# Patient Record
Sex: Male | Born: 1950 | Race: White | Hispanic: No | Marital: Single | Smoking: Never smoker
Health system: Southern US, Community
[De-identification: ages and names within clinical notes are randomized; demographics above are authoritative.]

## PROBLEM LIST (undated history)

## (undated) DIAGNOSIS — E785 Hyperlipidemia, unspecified: Secondary | ICD-10-CM

## (undated) HISTORY — PX: EYE SURGERY: SHX253

---

## 2013-11-18 ENCOUNTER — Ambulatory Visit
Admission: RE | Admit: 2013-11-18 | Discharge: 2013-11-18 | Disposition: A | Discharge status: Home / Self Care | Payer: BC Managed Care – PPO | Source: Ambulatory Visit | Attending: Family Medicine | Admitting: Family Medicine

## 2013-11-18 ENCOUNTER — Other Ambulatory Visit: Payer: Self-pay | Admitting: Family Medicine

## 2013-11-18 DIAGNOSIS — M542 Cervicalgia: Secondary | ICD-10-CM

## 2015-01-11 ENCOUNTER — Other Ambulatory Visit: Payer: Self-pay | Admitting: Family Medicine

## 2015-01-11 DIAGNOSIS — Z136 Encounter for screening for cardiovascular disorders: Secondary | ICD-10-CM

## 2015-12-20 ENCOUNTER — Other Ambulatory Visit: Payer: Self-pay | Admitting: Family Medicine

## 2015-12-20 DIAGNOSIS — Z0000 Encounter for general adult medical examination without abnormal findings: Secondary | ICD-10-CM

## 2016-02-18 ENCOUNTER — Ambulatory Visit
Admission: RE | Admit: 2016-02-18 | Discharge: 2016-02-18 | Disposition: A | Discharge status: Home / Self Care | Payer: BC Managed Care – PPO | Source: Ambulatory Visit | Attending: Family Medicine | Admitting: Family Medicine

## 2016-02-18 ENCOUNTER — Other Ambulatory Visit: Payer: Self-pay | Admitting: Family Medicine

## 2016-02-18 DIAGNOSIS — Z0000 Encounter for general adult medical examination without abnormal findings: Secondary | ICD-10-CM

## 2017-04-30 ENCOUNTER — Emergency Department (HOSPITAL_COMMUNITY): Payer: BC Managed Care – PPO

## 2017-04-30 ENCOUNTER — Encounter (HOSPITAL_COMMUNITY): Payer: Self-pay | Admitting: Emergency Medicine

## 2017-04-30 ENCOUNTER — Emergency Department (HOSPITAL_COMMUNITY)
Admission: EM | Admit: 2017-04-30 | Discharge: 2017-04-30 | Disposition: A | Discharge status: Home / Self Care | Payer: BC Managed Care – PPO | Attending: Emergency Medicine | Admitting: Emergency Medicine

## 2017-04-30 DIAGNOSIS — Z79899 Other long term (current) drug therapy: Secondary | ICD-10-CM | POA: Insufficient documentation

## 2017-04-30 DIAGNOSIS — M25511 Pain in right shoulder: Secondary | ICD-10-CM | POA: Diagnosis present

## 2017-04-30 DIAGNOSIS — M5412 Radiculopathy, cervical region: Secondary | ICD-10-CM

## 2017-04-30 DIAGNOSIS — R51 Headache: Secondary | ICD-10-CM | POA: Diagnosis not present

## 2017-04-30 HISTORY — DX: Hyperlipidemia, unspecified: E78.5

## 2017-04-30 LAB — BASIC METABOLIC PANEL
Anion gap: 8 (ref 5–15)
BUN: 24 mg/dL — AB (ref 6–20)
CHLORIDE: 106 mmol/L (ref 101–111)
CO2: 28 mmol/L (ref 22–32)
CREATININE: 1.14 mg/dL (ref 0.61–1.24)
Calcium: 9.3 mg/dL (ref 8.9–10.3)
GFR calc Af Amer: >60 mL/min (ref >60)
GFR calc non Af Amer: >60 mL/min (ref >60)
GLUCOSE: 104 mg/dL — AB (ref 65–99)
POTASSIUM: 4.8 mmol/L (ref 3.5–5.1)
Sodium: 142 mmol/L (ref 135–145)

## 2017-04-30 LAB — CBC WITH DIFFERENTIAL/PLATELET
Basophils Absolute: 0 10*3/uL (ref 0.0–0.1)
Basophils Relative: 0 %
EOS ABS: 0.2 10*3/uL (ref 0.0–0.7)
EOS PCT: 4 %
HCT: 41.9 % (ref 39.0–52.0)
Hemoglobin: 15.1 g/dL (ref 13.0–17.0)
LYMPHS ABS: 1.2 10*3/uL (ref 0.7–4.0)
LYMPHS PCT: 22 %
MCH: 32.4 pg (ref 26.0–34.0)
MCHC: 36 g/dL (ref 30.0–36.0)
MCV: 89.9 fL (ref 78.0–100.0)
MONO ABS: 0.5 10*3/uL (ref 0.1–1.0)
Monocytes Relative: 9 %
Neutro Abs: 3.6 10*3/uL (ref 1.7–7.7)
Neutrophils Relative %: 65 %
PLATELETS: 182 10*3/uL (ref 150–400)
RBC: 4.66 MIL/uL (ref 4.22–5.81)
RDW: 12.1 % (ref 11.5–15.5)
WBC: 5.6 10*3/uL (ref 4.0–10.5)

## 2017-04-30 LAB — I-STAT TROPONIN, ED: Troponin i, poc: 0.01 ng/mL (ref 0.00–0.08)

## 2017-04-30 MED ORDER — PREDNISONE 20 MG PO TABS: ORAL_TABLET | ORAL | 0 refills | Status: DC

## 2017-04-30 MED ORDER — LORAZEPAM 2 MG/ML IJ SOLN
2.0000 mg | Freq: Once | INTRAMUSCULAR | Status: AC
  Administered 2017-04-30: 2 mg via INTRAVENOUS
  Filled 2017-04-30: qty 1

## 2017-04-30 MED ORDER — GADOBENATE DIMEGLUMINE 529 MG/ML IV SOLN
20.0000 mL | Freq: Once | INTRAVENOUS | Status: AC | PRN
  Administered 2017-04-30: 19 mL via INTRAVENOUS

## 2017-04-30 NOTE — ED Triage Notes (Signed)
Pt c/o R shoulder pain radiating to R neck and headache, intermittent since this morning. Pt states he has had R shoulder pain x several years and treated with injections. Pt noticed tingling and numbness down R hand intermittently today. Pt states he also feels tightness in R jaw.

## 2017-04-30 NOTE — ED Provider Notes (Signed)
[facility] DEPT Provider Note   CSN: [id number] Arrival date & time: 04/30/17  0803     History   Chief Complaint Chief Complaint  Patient presents with  . Shoulder Pain    R  . Neck Pain    R  . Headache    HPI Andre Vega is a 66 y.o. male.  Patient is a 66 year old male with history of hyperlipidemia who presents with right-sided shoulder and neck pain. He states he has a history of some chronic pain in his right shoulder and he's had some prior injections in the shoulder. He states it's been hurting a little bit worse over the last few days but this morning he woke up and had pain that was going up into his right neck with the feeling that the right side of his face was numb and "drawing up".  He also felt the pain was going to the back of his neck on the right side as well as the front part of his neck on the right side. Also is going across his right chest and down his right arm. He felt like his right arm is weaker than normal.  He also reported some numbness in his right hand but denies any current numbness in the right hand. He denies any numbness or weakness to his legs. No slurred speech. No vision changes. No recent falls or injuries.  No ataxia.  He did say that his right arm got clammy during this incident but is back to normal currently.      Past Medical History:  Diagnosis Date  . Hyperlipidemia     There are no active problems to display for this patient.   Past Surgical History:  Procedure Laterality Date  . EYE SURGERY         Home Medications    Prior to Admission medications   Medication Sig Start Date End Date Taking? Authorizing Provider  aspirin EC 81 MG tablet Take 81 mg by mouth daily.   Yes [provider]  Cholecalciferol (VITAMIN D PO) Take 1 tablet by mouth daily.    Yes [provider]  CRESTOR 5 MG tablet Take 5 mg by mouth daily. 04/06/17  Yes [provider]  Cyanocobalamin (VITAMIN B 12 PO) Take 1  tablet by mouth daily.    Yes [provider]  escitalopram (LEXAPRO) 10 MG tablet Take 10 mg by mouth daily. 02/10/17  Yes [provider]  Ubiquinol 50 MG CAPS Take 50 mg by mouth daily.   Yes [provider]  predniSONE (DELTASONE) 20 MG tablet 3 tabs po day one, then 2 po daily x 4 days 04/30/17   [name], MD    Family History History reviewed. No pertinent family history.  Social History Social History  Substance Use Topics  . Smoking status: Never Smoker  . Smokeless tobacco: Never Used  . Alcohol use Yes     Allergies   Sulfa antibiotics   Review of Systems Review of Systems  Constitutional: Negative for chills and fever.  HENT: Negative for facial swelling, trouble swallowing and voice change.   Eyes: Negative for visual disturbance.  Respiratory: Negative for chest tightness and shortness of breath.   Cardiovascular: Positive for chest pain.  Gastrointestinal: Negative for nausea and vomiting.  Genitourinary: Negative.   Musculoskeletal: Positive for arthralgias and neck pain. Negative for back pain, joint swelling and neck stiffness.  Skin: Negative for color change.  Neurological: Positive for weakness and numbness. Negative  for speech difficulty and headaches.  Psychiatric/Behavioral: The patient is not nervous/anxious.      Physical Exam Updated Vital Signs BP (!) 155/98 (BP Location: Left Arm)   Pulse 66   Temp 98 F (36.7 C) (Oral)   Resp 16   Ht 6' (1.829 m)   Wt 92.5 kg (204 lb)   SpO2 99%   BMI 27.67 kg/m   Physical Exam  Constitutional: He is oriented to person, place, and time. He appears well-developed and well-nourished.  HENT:  Head: Normocephalic and atraumatic.  No carotid bruit  Eyes: Pupils are equal, round, and reactive to light.  Neck: Normal range of motion. Neck supple.  Patient has some tenderness to the right cervical paraspinal area and along the right trapezius muscle.There some mild pain to  the anterior portion of the neck as well. No swelling.  Cardiovascular: Normal rate, regular rhythm and normal heart sounds.   Pulmonary/Chest: Effort normal and breath sounds normal. No respiratory distress. He has no wheezes. He has no rales. He exhibits no tenderness.  Abdominal: Soft. Bowel sounds are normal. There is no tenderness. There is no rebound and no guarding.  Musculoskeletal: Normal range of motion. He exhibits no edema.  There some mild pain on range of motion of the right shoulder. There is no swelling to the arm. Radial pulses are intact. No discoloration of the arm.  Lymphadenopathy:    He has no cervical adenopathy.  Neurological: He is alert and oriented to person, place, and time.  Motor 5/5 all extremities Sensation grossly intact to LT all extremities Finger to Nose intact, no pronator drift CN II-XII grossly intact    Skin: Skin is warm and dry. No rash noted.  Psychiatric: He has a normal mood and affect.     ED Treatments / Results  Labs (all labs ordered are listed, but only abnormal results are displayed) Labs Reviewed  BASIC METABOLIC PANEL - Abnormal; Notable for the following:       Result Value   Glucose, Bld 104 (*)    BUN 24 (*)    All other components within normal limits  CBC WITH DIFFERENTIAL/PLATELET  I-STAT TROPONIN, ED    EKG  EKG Interpretation  Date/Time:  Thursday April 30 2017 09:23:37 EDT Ventricular Rate:  65 PR Interval:    QRS Duration: 95 QT Interval:  408 QTC Calculation: 425 R Axis:   25 Text Interpretation:  Sinus rhythm No old tracing to compare Confirmed by [name] [phone]) on 04/30/2017 9:40:19 AM       Radiology Mr Angiogram Neck W Or Wo Contrast  Result Date: 04/30/2017 CLINICAL DATA:  Pain radiating to the right neck.  Headache. EXAM: MRA NECK WITHOUT AND WITH CONTRAST TECHNIQUE: Multiplanar and multiecho pulse sequences of the neck were obtained without and with intravenous contrast. Angiographic  images of the neck were obtained using MRA technique without and with intravenous contrast. CONTRAST:  75m MULTIHANCE GADOBENATE DIMEGLUMINE 529 MG/ML IV SOLN COMPARISON:  None. FINDINGS: Normal appearance of the visualized aorta. Four vessel branching pattern with left vertebral artery arising from the arch. Vertebral and carotid vessels are smooth and widely patent to the dura. There is antegrade flow in both carotid and vertebral circulations. IMPRESSION: Negative neck MRA.  No stenosis or evidence of dissection. Electronically Signed   By: JMonte FantasiaM.D.   On: 04/30/2017 12:01   Mr Cervical Spine Wo Contrast  Result Date: 04/30/2017 CLINICAL DATA:  Acute presentation with right shoulder pain  and right neck pain. Headache. Symptoms began this morning. Tingling in the right hand. EXAM: MRI CERVICAL SPINE WITHOUT CONTRAST TECHNIQUE: Multiplanar, multisequence MR imaging of the cervical spine was performed. No intravenous contrast was administered. COMPARISON:  Radiography 11/18/2013 FINDINGS: The study suffers from motion degradation because of patient discomfort. Alignment: Normal except for 2 mm retrolisthesis C4-5. Vertebrae: Discogenic edematous marrow changes at C4-5 and C5-6 which could be associated with neck pain. Cord: No cord compression or primary cord lesion. Posterior Fossa, vertebral arteries, paraspinal tissues: Negative Disc levels: Foramen magnum, and C1-2:  Normal. C2-3:  Chronic posterior element fusion on the right.  No stenosis. C3-4: Small endplate osteophytes and mild bulging of the disc. Mild facet arthritis. No compressive stenosis of the canal. Mild bilateral foraminal narrowing. C4-5: 2 mm retrolisthesis. Spondylosis with endplate osteophytes that narrow the ventral subarachnoid space but do not compress the cord. Foraminal stenosis bilaterally that could affect either C5 nerve. Discogenic marrow edema that could be associated with neck pain. C5-6: Spondylosis with endplate  osteophytes and bulging of the disc. Narrowing of the ventral subarachnoid space but no compression of the cord. Foraminal stenosis right worse than left. Either C6 nerve could be affected. Discogenic marrow edema could be associated with neck pain. C6-7: Spondylosis with endplate osteophytes and bulging of the disc more prominent towards the left. No compressive central canal stenosis. Foraminal narrowing on the left that could affect the C7 nerve. C7-T1:  Normal interspace. IMPRESSION: Significantly motion degraded exam due to patient discomfort. Discogenic marrow edema at C4, C5 and C6 which could be associated with neck pain. C3-4: Bilateral foraminal narrowing that could affect the C4 nerves. C5-6: Bilateral foraminal narrowing that could affect the C5 nerves. C5-6: Bilateral foraminal narrowing that could affect the C6 nerves. C6-7:  Left foraminal narrowing that could affect the left C7 nerve. Electronically Signed   By: Nelson Chimes M.D.   On: 04/30/2017 11:41    Procedures Procedures (including critical care time)  Medications Ordered in ED Medications  gadobenate dimeglumine (MULTIHANCE) injection 20 mL (19 mLs Intravenous Contrast Given 04/30/17 1142)  LORazepam (ATIVAN) injection 2 mg (2 mg Intravenous Given 04/30/17 1043)     Initial Impression / Assessment and Plan / ED Course  I have reviewed the triage vital signs and the nursing notes.  Pertinent labs & imaging results that were available during my care of the patient were reviewed by me and considered in my medical decision making (see chart for details).     Patient presents with pain radiating from his shoulder up to his neck and the right side of his face with some numbness to his right face and right hand. He was given dose Ativan for MRI and his symptoms have almost resolved. He still has some shoulder pain but the other symptoms have resolved. Shoulder pain now was consistent with his prior episodes of shoulder pain. He  doesn't have symptoms that would be more suggestive of a stroke. There is no speech or vision deficits. No lower extremity involvement. No suggestions of acute coronary syndrome. There is no ischemic changes on EKG. His troponin is negative. There is no evidence of carotid dissection. He does have some foraminal narrowing on his MRI. If the symptoms are likely resulting from a cervical radiculopathy. He will be started on a steroid five-day burst. He was encouraged to follow-up with his orthopedist at [place]. Return precautions were given.  Final Clinical Impressions(s) / ED Diagnoses   Final diagnoses:  Cervical radiculopathy    New Prescriptions New Prescriptions   PREDNISONE (DELTASONE) 20 MG TABLET    3 tabs po day one, then 2 po daily x 4 days     [name], MD 04/30/17 1221

## 2018-02-04 ENCOUNTER — Encounter (HOSPITAL_COMMUNITY): Payer: Self-pay

## 2018-02-04 ENCOUNTER — Other Ambulatory Visit: Payer: Self-pay

## 2018-02-04 ENCOUNTER — Emergency Department (HOSPITAL_COMMUNITY): Payer: BC Managed Care – PPO

## 2018-02-04 ENCOUNTER — Emergency Department (HOSPITAL_COMMUNITY)
Admission: EM | Admit: 2018-02-04 | Discharge: 2018-02-04 | Disposition: A | Discharge status: Home / Self Care | Payer: BC Managed Care – PPO | Attending: Emergency Medicine | Admitting: Emergency Medicine

## 2018-02-04 DIAGNOSIS — R140 Abdominal distension (gaseous): Secondary | ICD-10-CM | POA: Diagnosis not present

## 2018-02-04 DIAGNOSIS — R5381 Other malaise: Secondary | ICD-10-CM | POA: Diagnosis not present

## 2018-02-04 DIAGNOSIS — D693 Immune thrombocytopenic purpura: Secondary | ICD-10-CM

## 2018-02-04 DIAGNOSIS — D696 Thrombocytopenia, unspecified: Secondary | ICD-10-CM | POA: Diagnosis not present

## 2018-02-04 DIAGNOSIS — Z79899 Other long term (current) drug therapy: Secondary | ICD-10-CM | POA: Insufficient documentation

## 2018-02-04 DIAGNOSIS — Z7982 Long term (current) use of aspirin: Secondary | ICD-10-CM | POA: Diagnosis not present

## 2018-02-04 DIAGNOSIS — R799 Abnormal finding of blood chemistry, unspecified: Secondary | ICD-10-CM | POA: Diagnosis present

## 2018-02-04 LAB — CBC WITH DIFFERENTIAL/PLATELET
BASOS ABS: 0 10*3/uL (ref 0.0–0.1)
Basophils Relative: 1 %
Eosinophils Absolute: 0.2 10*3/uL (ref 0.0–0.7)
Eosinophils Relative: 3 %
HEMATOCRIT: 44 % (ref 39.0–52.0)
HEMOGLOBIN: 15.4 g/dL (ref 13.0–17.0)
Lymphocytes Relative: 23 %
Lymphs Abs: 1.5 10*3/uL (ref 0.7–4.0)
MCH: 32.2 pg (ref 26.0–34.0)
MCHC: 35 g/dL (ref 30.0–36.0)
MCV: 91.9 fL (ref 78.0–100.0)
Monocytes Absolute: 0.4 10*3/uL (ref 0.1–1.0)
Monocytes Relative: 6 %
NEUTROS ABS: 4.4 10*3/uL (ref 1.7–7.7)
NEUTROS PCT: 67 %
PLATELETS: 19 10*3/uL — AB (ref 150–400)
RBC: 4.79 MIL/uL (ref 4.22–5.81)
RDW: 12 % (ref 11.5–15.5)
WBC: 6.5 10*3/uL (ref 4.0–10.5)

## 2018-02-04 LAB — COMPREHENSIVE METABOLIC PANEL
ALBUMIN: 4.1 g/dL (ref 3.5–5.0)
ALK PHOS: 58 U/L (ref 38–126)
ALT: 47 U/L — ABNORMAL HIGH (ref 0–44)
ANION GAP: 8 (ref 5–15)
AST: 32 U/L (ref 15–41)
BILIRUBIN TOTAL: 0.7 mg/dL (ref 0.3–1.2)
BUN: 18 mg/dL (ref 8–23)
CHLORIDE: 107 mmol/L (ref 98–111)
CO2: 27 mmol/L (ref 22–32)
Calcium: 9.2 mg/dL (ref 8.9–10.3)
Creatinine, Ser: 1.05 mg/dL (ref 0.61–1.24)
GFR calc Af Amer: >60 mL/min (ref >60)
Glucose, Bld: 138 mg/dL — ABNORMAL HIGH (ref 70–99)
Potassium: 4.2 mmol/L (ref 3.5–5.1)
SODIUM: 142 mmol/L (ref 135–145)
TOTAL PROTEIN: 6.9 g/dL (ref 6.5–8.1)

## 2018-02-04 LAB — LACTATE DEHYDROGENASE: LDH: 143 U/L (ref 98–192)

## 2018-02-04 LAB — URINALYSIS, ROUTINE W REFLEX MICROSCOPIC
BILIRUBIN URINE: NEGATIVE
Glucose, UA: NEGATIVE mg/dL
Hgb urine dipstick: NEGATIVE
Ketones, ur: NEGATIVE mg/dL
LEUKOCYTES UA: NEGATIVE
NITRITE: NEGATIVE
Protein, ur: NEGATIVE mg/dL
SPECIFIC GRAVITY, URINE: 1.014 (ref 1.005–1.030)
pH: 8 (ref 5.0–8.0)

## 2018-02-04 LAB — APTT: APTT: 23 s — AB (ref 24–36)

## 2018-02-04 LAB — MONONUCLEOSIS SCREEN: Mono Screen: NEGATIVE

## 2018-02-04 LAB — PROTIME-INR
INR: 0.97
Prothrombin Time: 12.8 seconds (ref 11.4–15.2)

## 2018-02-04 LAB — SAVE SMEAR

## 2018-02-04 MED ORDER — PREDNISONE 20 MG PO TABS: 60.0000 mg | ORAL_TABLET | Freq: Every day | ORAL | 0 refills | Status: AC

## 2018-02-04 MED ORDER — METHYLPREDNISOLONE SODIUM SUCC 125 MG IJ SOLR
80.0000 mg | Freq: Once | INTRAMUSCULAR | Status: AC
  Administered 2018-02-04: 80 mg via INTRAVENOUS
  Filled 2018-02-04: qty 2

## 2018-02-04 NOTE — ED Triage Notes (Signed)
Pt went to routine physical today with Dr Yves Dill.  Pt states that his platelet count is 17, compared to 200s last year. Pt states he has been tired and bruising a lot lately.

## 2018-02-04 NOTE — ED Notes (Signed)
Dr. Learta Codding hematologist at bedside.

## 2018-02-04 NOTE — ED Provider Notes (Signed)
[facility] DEPT Provider Note   CSN: [id number] Arrival date & time: 02/04/18  1324     History   Chief Complaint Chief Complaint  Patient presents with  . Abnormal Lab    HPI Andre Vega is a 67 y.o. male.  HPI Patient had routine blood work during his physical and was advised to come to the emergency department because his platelet count was 17,000.  States for the last 2 to 3 weeks he has had increased drowsiness and decreased energy.  He is noticed easy bruising but denies any hematuria or bloody stools.  Occasionally has some abdominal fullness but denies any abdominal pain.  States he does drink 1 bottle of red wine daily.  Denies headaches, visual changes, focal weakness or numbness, nausea or vomiting.  No recent infections. Past Medical History:  Diagnosis Date  . Hyperlipidemia     There are no active problems to display for this patient.   Past Surgical History:  Procedure Laterality Date  . EYE SURGERY          Home Medications    Prior to Admission medications   Medication Sig Start Date End Date Taking? Authorizing Provider  aspirin EC 81 MG tablet Take 81 mg by mouth daily.   Yes [provider]  Cholecalciferol (VITAMIN D PO) Take 1 tablet by mouth daily.    Yes [provider]  CRESTOR 5 MG tablet Take 5 mg by mouth daily. 04/06/17  Yes [provider]  Cyanocobalamin (VITAMIN B 12 PO) Take 1 tablet by mouth daily.    Yes [provider]  escitalopram (LEXAPRO) 10 MG tablet Take 10 mg by mouth daily. 02/10/17  Yes [provider]  finasteride (PROPECIA) 1 MG tablet Take 1 mg by mouth daily.   Yes [provider]  hydrocortisone (ANUSOL-HC) 25 MG suppository Place 25 mg rectally daily as needed. For hemorhoids 11/28/17  Yes [provider]  Ubiquinol 50 MG CAPS Take 50 mg by mouth daily.   Yes [provider]  predniSONE (DELTASONE) 20 MG tablet Take 3  tablets (60 mg total) by mouth daily for 14 days. 02/04/18 02/18/18  [name], MD    Family History No family history on file.  Social History Social History   Tobacco Use  . Smoking status: Never Smoker  . Smokeless tobacco: Never Used  Substance Use Topics  . Alcohol use: Yes  . Drug use: No     Allergies   Bee venom and Sulfa antibiotics   Review of Systems Review of Systems  Constitutional: Positive for fatigue. Negative for chills and fever.  Eyes: Negative for visual disturbance.  Respiratory: Negative for cough and shortness of breath.   Cardiovascular: Negative for chest pain and leg swelling.  Gastrointestinal: Positive for abdominal distention. Negative for abdominal pain, blood in stool, constipation, diarrhea, nausea and vomiting.  Genitourinary: Negative for hematuria.  Musculoskeletal: Negative for back pain, myalgias, neck pain and neck stiffness.  Skin: Negative for rash and wound.  Neurological: Negative for dizziness, weakness, light-headedness, numbness and headaches.  Hematological: Negative for adenopathy. Bruises/bleeds easily.  Psychiatric/Behavioral: The patient is nervous/anxious.   All other systems reviewed and are negative.    Physical Exam Updated Vital Signs BP (!) 132/99   Pulse 74   Temp 97.7 F (36.5 C) (Oral)   Resp 15   Ht 5' 11"  (1.803 m)   Wt 93 kg (205 lb)   SpO2 100%   BMI 28.59  kg/m   Physical Exam  Constitutional: He is oriented to person, place, and time. He appears well-developed and well-nourished. No distress.  HENT:  Head: Normocephalic and atraumatic.  Mouth/Throat: Oropharynx is clear and moist. No oropharyngeal exudate.  Eyes: Pupils are equal, round, and reactive to light. EOM are normal.  Neck: Normal range of motion. Neck supple. No JVD present.  Cardiovascular: Normal rate and regular rhythm. Exam reveals no gallop and no friction rub.  No murmur heard. Pulmonary/Chest: Effort normal and breath sounds  normal. No stridor. No respiratory distress. He has no wheezes. He has no rales. He exhibits no tenderness.  Abdominal: Soft. Bowel sounds are normal. There is no tenderness. There is no rebound and no guarding.  No appreciated splenomegaly or hepatomegaly  Musculoskeletal: Normal range of motion. He exhibits no edema or tenderness.  No midline thoracic or lumbar tenderness.  No lower extremity swelling, asymmetry or tenderness.  Distal pulses intact.  Lymphadenopathy:    He has no cervical adenopathy.  Neurological: He is alert and oriented to person, place, and time.  5/5 motor in all extremities.  Sensation fully intact.  Skin: Skin is warm and dry. Rash noted. He is not diaphoretic. No erythema.  Patient has some nonblanching petechiae to both ankles.  Also has bruising to his left deltoid.  Psychiatric: He has a normal mood and affect. His behavior is normal.  Nursing note and vitals reviewed.    ED Treatments / Results  Labs (all labs ordered are listed, but only abnormal results are displayed) Labs Reviewed  COMPREHENSIVE METABOLIC PANEL - Abnormal; Notable for the following components:      Result Value   Glucose, Bld 138 (*)    ALT 47 (*)    All other components within normal limits  CBC WITH DIFFERENTIAL/PLATELET - Abnormal; Notable for the following components:   Platelets 19 (*)    All other components within normal limits  APTT - Abnormal; Notable for the following components:   aPTT 23 (*)    All other components within normal limits  PROTIME-INR  URINALYSIS, ROUTINE W REFLEX MICROSCOPIC  SAVE SMEAR  HIV ANTIBODY (ROUTINE TESTING)  LACTATE DEHYDROGENASE  MONONUCLEOSIS SCREEN  EBV AB TO VIRAL CAPSID AG PNL, IGG+IGM    EKG None  Radiology US Abdomen Complete  Result Date: 02/04/2018 CLINICAL DATA:  67 y/o  M; distended abdomen and thrombocytopenia. EXAM: ABDOMEN ULTRASOUND COMPLETE COMPARISON:  02/18/2016 aortic ultrasound FINDINGS: Gallbladder: No gallstones  or wall thickening visualized. No sonographic Murphy sign noted by sonographer. Common bile duct: Diameter: 4.8 mm Liver: Simple liver cyst measuring up to 3.1 cm in the right lobe peripherally. Mildly increased liver echogenicity. Portal vein is patent on color Doppler imaging with normal direction of blood flow towards the liver. IVC: No abnormality visualized. Pancreas: Largely obscured by bowel gas, visualized portions are normal. Spleen: Size and appearance within normal limits. Right Kidney: Length: 11.5 cm. Echogenicity within normal limits. No mass or hydronephrosis visualized. Left Kidney: Length: 12.7 cm. Echogenicity within normal limits. No mass or hydronephrosis visualized. Abdominal aorta: No aneurysm visualized. Other findings: None. IMPRESSION: Simple liver cysts and hepatic steatosis. No acute process identified. Electronically Signed   By: Kristine Garbe M.D.   On: 02/04/2018 15:18    Procedures Procedures (including critical care time)  Medications Ordered in ED Medications  methylPREDNISolone sodium succinate (SOLU-MEDROL) 125 mg/2 mL injection 80 mg (80 mg Intravenous Given 02/04/18 1857)     Initial Impression / Assessment and Plan /  ED Course  I have reviewed the triage vital signs and the nursing notes.  Pertinent labs & imaging results that were available during my care of the patient were reviewed by me and considered in my medical decision making (see chart for details).     Discussed with Dr. Learta Codding.  He advises save smear, LDH and HIV in addition to current labs.  Patient's platelet count is 19,000.  Ultrasound abdomen without acute findings.  Does not appear to have any active bleeding.  Hemoglobin is stable.  Discussed with Dr. 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

## 2018-02-04 NOTE — ED Provider Notes (Signed)
Patient seen by hematology.  Dr. Benay Spice is requesting Solu-Medrol now.  Addition of mononucleosis testing.  High-dose prednisone for home taper to be determined by response   [name], MD 02/04/18 2352

## 2018-02-04 NOTE — ED Notes (Signed)
ED Provider at bedside. 

## 2018-02-04 NOTE — Discharge Instructions (Addendum)
Please follow up with Dr Benay Spice.  You will be contacted by his office in the morning with the appointment time.  Take the prednisone as prescribed (Dr Benay Spice will change this dose eventually once you begin to respond).

## 2018-02-04 NOTE — ED Notes (Signed)
Date and time results received: 02/04/18 2:39 PM  (use smartphrase ".now" to insert current time)  Test: Platelets Critical Value: 19  Name of Provider Notified: Lita Mains  Orders Received? Or Actions Taken?: MD and RN aware, waiting for orders

## 2018-02-04 NOTE — Consult Note (Signed)
New Hematology/Oncology Consult   Referral BW:I[facility]       Reason for Referral: Thrombocytopenia  HPI: [name] saw Dr. Stephanie Acre for a routine physical earlier today.  A CBC was remarkable for a platelet count of 17,000.  He was referred to the [facility] emergency room for further evaluation. He reports easy bruising for the past several weeks.  No other bleeding.  He recently returned from a trip to [place].  He has noted fatigue for several months.  He is being treated for an infection and a right ear biopsy site, performed earlier this week.      Past Medical History:  Diagnosis Date  . Hyperlipidemia   : .  Purple tunnel syndrome   .  Cervical disc disease  Past Surgical History:  Procedure Laterality Date  .  Bilateral cataract surgery    :  .  Vasectomy   Current Facility-Administered Medications:  .  methylPREDNISolone sodium succinate (SOLU-MEDROL) 125 mg/2 mL injection 80 mg, 80 mg, Intravenous, Once, [name], MD  Current Outpatient Medications:  .  aspirin EC 81 MG tablet, Take 81 mg by mouth daily., Disp: , Rfl:  .  Cholecalciferol (VITAMIN D PO), Take 1 tablet by mouth daily. , Disp: , Rfl:  .  CRESTOR 5 MG tablet, Take 5 mg by mouth daily., Disp: , Rfl:  .  Cyanocobalamin (VITAMIN B 12 PO), Take 1 tablet by mouth daily. , Disp: , Rfl:  .  escitalopram (LEXAPRO) 10 MG tablet, Take 10 mg by mouth daily., Disp: , Rfl:  .  finasteride (PROPECIA) 1 MG tablet, Take 1 mg by mouth daily., Disp: , Rfl:  .  hydrocortisone (ANUSOL-HC) 25 MG suppository, Place 25 mg rectally daily as needed. For hemorhoids, Disp: , Rfl:  .  Ubiquinol 50 MG CAPS, Take 50 mg by mouth daily., Disp: , Rfl:  .  predniSONE (DELTASONE) 20 MG tablet, Take 3 tablets (60 mg total) by mouth daily for 14 days., Disp: 42 tablet, Rfl: 0:  . methylPREDNISolone (SOLU-MEDROL) injection  80 mg Intravenous Once  :  Allergies  Allergen Reactions  . Bee Venom Anaphylaxis  . Sulfa  Antibiotics     Childhood reaction   :  FH: 2 maternal uncles died of "leukemia".  SOCIAL HISTORY: He lives with his wife in [place].  He is a Mining engineer at [employer].  He does not use cigarettes.  He drinks 1-1.5 bottles of wine each day.  No transfusion history.  No risk factor for HIV or hepatitis.  Review of Systems:  Positives include: Malaise, easy bruising, infected right ear skin biopsy site, soreness of the neck  A complete ROS was otherwise negative.   Physical Exam:  Blood pressure (!) 132/99, pulse 74, temperature 97.7 F (36.5 C), temperature source Oral, resp. rate 15, height 5' 11"  (1.803 m), weight 205 lb (93 kg), SpO2 100 %.  HEENT: Oropharynx without visible mass, pharynx without erythema or exudate, no thrush.  Neck without mass Lungs: Clear bilaterally Cardiac: Regular rate and rhythm Abdomen: No hepatosplenomegaly, no mass, nontender GU: Testes without mass Vascular: No leg edema Lymph nodes: No cervical, supraclavicular, axillary, or inguinal nodes Neurologic: The motor exam is intact in the upper and lower extremities, alert and oriented Skin: No rash, small ecchymoses at the bilateral arms, abdomen, and left chest wall Musculoskeletal: No spine tenderness  LABS:  Recent Labs    02/04/18 1350  WBC 6.5  HGB 15.4  HCT 44.0  PLT  19*  MCV 91.9  Blood smear: The red cell morphology is unremarkable, the polychromasia is not increased, the platelets are decreased in number with scattered large platelets.  There are reactive appearing lymphocytes.  No hypersegmented neutrophils.  No blasts or other young forms are present   Recent Labs    02/04/18 1350  NA 142  K 4.2  CL 107  CO2 27  GLUCOSE 138*  BUN 18  CREATININE 1.05  CALCIUM 9.2   Albumin 4.1, AST 42, ALT 47, bilirubin 0.7, LDH 143   RADIOLOGY:  US Abdomen Complete  Result Date: 02/04/2018 CLINICAL DATA:  67 y/o  M; distended abdomen and thrombocytopenia. EXAM: ABDOMEN  ULTRASOUND COMPLETE COMPARISON:  02/18/2016 aortic ultrasound FINDINGS: Gallbladder: No gallstones or wall thickening visualized. No sonographic Murphy sign noted by sonographer. Common bile duct: Diameter: 4.8 mm Liver: Simple liver cyst measuring up to 3.1 cm in the right lobe peripherally. Mildly increased liver echogenicity. Portal vein is patent on color Doppler imaging with normal direction of blood flow towards the liver. IVC: No abnormality visualized. Pancreas: Largely obscured by bowel gas, visualized portions are normal. Spleen: Size and appearance within normal limits. Right Kidney: Length: 11.5 cm. Echogenicity within normal limits. No mass or hydronephrosis visualized. Left Kidney: Length: 12.7 cm. Echogenicity within normal limits. No mass or hydronephrosis visualized. Abdominal aorta: No aneurysm visualized. Other findings: None. IMPRESSION: Simple liver cysts and hepatic steatosis. No acute process identified. Electronically Signed   By: Kristine Garbe M.D.   On: 02/04/2018 15:18    Assessment and Plan:   1.  Thrombocytopenia 2.   History of moderate to heavy alcohol use 3.   Malaise  [name] was referred 67 to the emergency room with new onset severe thrombocytopenia.  I discussed the differential diagnosis with 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  The differential diagnosis includes thrombocytopenia associated with a viral illness such as an EBV infection.  The malaise and appearance of peripheral blood lymphocytes are consistent with this.  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  There is no clinical evidence of another condition such as a malignancy or collagen vascular disease associated with ITP.  Recommendations: 1.  Begin steroid therapy with Solu-Medrol tonight and then prednisone (60 mg daily) beginning 02/05/2018 2.  Check EBV and HIV serologies 3.  Outpatient follow-up in the hematology clinic will be scheduled for 02/08/2018 4.  Discontinue aspirin  He  will call for bleeding.  [name], MD 02/04/2018, 6:40 PM

## 2018-02-04 NOTE — ED Notes (Signed)
Pt care assumed, obtained verbal report.  Pt is ambulatory with steady gait.  He is waiting for hematologist to consult.  He denies any complaints.  Drink and crackers given to pt.

## 2018-02-05 ENCOUNTER — Telehealth: Payer: Self-pay | Admitting: Oncology

## 2018-02-05 ENCOUNTER — Other Ambulatory Visit: Payer: Self-pay

## 2018-02-05 DIAGNOSIS — D696 Thrombocytopenia, unspecified: Secondary | ICD-10-CM

## 2018-02-05 LAB — HIV ANTIBODY (ROUTINE TESTING W REFLEX): HIV Screen 4th Generation wRfx: NONREACTIVE

## 2018-02-05 LAB — EBV AB TO VIRAL CAPSID AG PNL, IGG+IGM
EBV VCA IGG: 423 U/mL — AB (ref 0.0–17.9)
EBV VCA IgM: <36 U/mL (ref 0.0–35.9)

## 2018-02-05 NOTE — Telephone Encounter (Signed)
Called pt re appt that was added per 7/12 sch msg - spoke w/ pt re appts.

## 2018-02-08 ENCOUNTER — Telehealth: Payer: Self-pay | Admitting: Nurse Practitioner

## 2018-02-08 ENCOUNTER — Encounter: Payer: Self-pay | Admitting: Nurse Practitioner

## 2018-02-08 ENCOUNTER — Inpatient Hospital Stay: Payer: BC Managed Care – PPO | Attending: Nurse Practitioner | Admitting: Nurse Practitioner

## 2018-02-08 ENCOUNTER — Inpatient Hospital Stay: Payer: BC Managed Care – PPO

## 2018-02-08 VITALS — BP 126/82 | HR 77 | Temp 97.9°F | Resp 18 | Ht 71.0 in | Wt 207.0 lb

## 2018-02-08 DIAGNOSIS — R5381 Other malaise: Secondary | ICD-10-CM | POA: Insufficient documentation

## 2018-02-08 DIAGNOSIS — D696 Thrombocytopenia, unspecified: Secondary | ICD-10-CM

## 2018-02-08 LAB — CBC WITH DIFFERENTIAL (CANCER CENTER ONLY)
BASOS ABS: 0 10*3/uL (ref 0.0–0.1)
BASOS PCT: 0 %
EOS PCT: 0 %
Eosinophils Absolute: 0 10*3/uL (ref 0.0–0.5)
HEMATOCRIT: 43.8 % (ref 38.4–49.9)
Hemoglobin: 15.5 g/dL (ref 13.0–17.1)
LYMPHS PCT: 8 %
Lymphs Abs: 1 10*3/uL (ref 0.9–3.3)
MCH: 32.6 pg (ref 27.2–33.4)
MCHC: 35.4 g/dL (ref 32.0–36.0)
MCV: 92 fL (ref 79.3–98.0)
Monocytes Absolute: 0.6 10*3/uL (ref 0.1–0.9)
Monocytes Relative: 5 %
NEUTROS ABS: 9.9 10*3/uL — AB (ref 1.5–6.5)
Neutrophils Relative %: 87 %
PLATELETS: 45 10*3/uL — AB (ref 140–400)
RBC: 4.76 MIL/uL (ref 4.20–5.82)
RDW: 12.1 % (ref 11.0–14.6)
WBC: 11.5 10*3/uL — AB (ref 4.0–10.3)

## 2018-02-08 NOTE — Patient Instructions (Signed)
Prednisone 60 mg daily 7/15, 7/16, 7/17 Begin Prednisone 40 mg daily on 7/18 Return for lab in 1 week Return for lab and office visit in 2 weeks

## 2018-02-08 NOTE — Progress Notes (Addendum)
  [place] OFFICE PROGRESS NOTE   Diagnosis: Thrombocytopenia  INTERVAL HISTORY:   Andre Vega returns for his first outpatient visit since he was found to have thrombocytopenia last week.  He was seen by Dr. Benay Spice in the emergency department on 02/04/2018.  He was referred to the emergency department due to a platelet count of 17,000.  Repeat CBC in the emergency room showed a platelet count of 19,000.  He received Solu-Medrol 80 mg x 1 and was discharged home on prednisone 60 mg daily beginning 02/05/2018.  He notes bruises are resolving.  No new bruises.  He denies epistaxis and gingival bleeding.  No mouth discomfort.  No leg swelling.  He has noted no difficulty sleeping.  Objective:  Vital signs in last 24 hours:  Blood pressure 126/82, pulse 77, temperature 97.9 F (36.6 C), temperature source Oral, resp. rate 18, height 5' 11"  (1.803 m), weight 207 lb (93.9 kg), SpO2 98 %.    HEENT: No thrush or ulcers. Lymphatics: No palpable cervical, supraclavicular or axillary lymph nodes. Resp: Lungs clear bilaterally. Cardio: Regular rate and rhythm. GI: Abdomen soft and nontender.  No hepatosplenomegaly. Vascular: No leg edema. Skin: Resolving ecchymoses bilateral upper extremities, left abdomen.   Lab Results:  Lab Results  Component Value Date   WBC 11.5 (H) 02/08/2018   HGB 15.5 02/08/2018   HCT 43.8 02/08/2018   MCV 92.0 02/08/2018   PLT 45 (L) 02/08/2018   NEUTROABS 9.9 (H) 02/08/2018  Mono screen negative; EBV IgG positive; EBV IgM negative; HIV nonreactive  Imaging:  No results found.  Medications: I have reviewed the patient's current medications.  Assessment/Plan: 1. Thrombocytopenia  02/04/2018 platelet count 19,000  Solu-Medrol 80 mg IV 02/04/2018  Prednisone 60 mg daily beginning 02/05/2018  02/08/2018 platelet count 45,000 2. History of moderate to heavy alcohol use 3. Malaise 4. 02/04/2018 mono screen negative; EBV IgG positive, EBV  IgM negative  Disposition: Andre Vega appears stable.  The platelet count is higher.  He will continue prednisone 60 mg daily until 02/11/2018 at which time he will decrease the dose to 40 mg daily.  He will return for a follow-up CBC on 02/15/2018.  We will see him in follow-up with labs on 02/22/2018.  He will contact the office in the interim with any problems.  Patient seen with Dr. Benay Spice.  Ned Card ANP/GNP-BC   02/08/2018  3:57 PM  This was a shared visit with Ned Card.  Andre Vega appears well.  The platelet count has improved partially with 4 days of prednisone therapy.  He will continue prednisone at a dose of 60 mg daily through 02/10/2018 and then decrease the prednisone dose to 40 mg daily.  He will return for a CBC in 1 week.  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  We will check a rheumatoid factor and ANA.  Julieanne Manson, MD

## 2018-02-08 NOTE — Telephone Encounter (Signed)
Gave patient avs and calendar of upcoming July appts.

## 2018-02-15 ENCOUNTER — Inpatient Hospital Stay: Payer: BC Managed Care – PPO

## 2018-02-15 DIAGNOSIS — D696 Thrombocytopenia, unspecified: Secondary | ICD-10-CM

## 2018-02-15 LAB — CBC WITH DIFFERENTIAL (CANCER CENTER ONLY)
BASOS PCT: 1 %
Basophils Absolute: 0.1 10*3/uL (ref 0.0–0.1)
EOS ABS: 0.2 10*3/uL (ref 0.0–0.5)
EOS PCT: 2 %
HCT: 44.8 % (ref 38.4–49.9)
HEMOGLOBIN: 15.4 g/dL (ref 13.0–17.1)
Lymphocytes Relative: 22 %
Lymphs Abs: 2.4 10*3/uL (ref 0.9–3.3)
MCH: 32.1 pg (ref 27.2–33.4)
MCHC: 34.3 g/dL (ref 32.0–36.0)
MCV: 93.5 fL (ref 79.3–98.0)
Monocytes Absolute: 0.8 10*3/uL (ref 0.1–0.9)
Monocytes Relative: 8 %
Neutro Abs: 7.2 10*3/uL — ABNORMAL HIGH (ref 1.5–6.5)
Neutrophils Relative %: 67 %
PLATELETS: 92 10*3/uL — AB (ref 140–400)
RBC: 4.79 MIL/uL (ref 4.20–5.82)
RDW: 12 % (ref 11.0–14.6)
WBC: 10.6 10*3/uL — AB (ref 4.0–10.3)

## 2018-02-16 LAB — ANA W/REFLEX IF POSITIVE: Anti Nuclear Antibody(ANA): NEGATIVE

## 2018-02-16 LAB — RHEUMATOID FACTOR: Rhuematoid fact SerPl-aCnc: 11.5 IU/mL (ref 0.0–13.9)

## 2018-02-22 ENCOUNTER — Inpatient Hospital Stay: Payer: BC Managed Care – PPO

## 2018-02-22 ENCOUNTER — Encounter: Payer: Self-pay | Admitting: Nurse Practitioner

## 2018-02-22 ENCOUNTER — Telehealth: Payer: Self-pay | Admitting: Oncology

## 2018-02-22 ENCOUNTER — Inpatient Hospital Stay (HOSPITAL_BASED_OUTPATIENT_CLINIC_OR_DEPARTMENT_OTHER): Payer: BC Managed Care – PPO | Admitting: Nurse Practitioner

## 2018-02-22 VITALS — BP 127/82 | HR 83 | Temp 97.8°F | Resp 19 | Ht 71.0 in | Wt 206.3 lb

## 2018-02-22 DIAGNOSIS — D696 Thrombocytopenia, unspecified: Secondary | ICD-10-CM | POA: Diagnosis not present

## 2018-02-22 DIAGNOSIS — R5381 Other malaise: Secondary | ICD-10-CM | POA: Diagnosis not present

## 2018-02-22 DIAGNOSIS — D693 Immune thrombocytopenic purpura: Secondary | ICD-10-CM

## 2018-02-22 LAB — CBC WITH DIFFERENTIAL (CANCER CENTER ONLY)
BASOS ABS: 0 10*3/uL (ref 0.0–0.1)
Basophils Relative: 0 %
EOS ABS: 0 10*3/uL (ref 0.0–0.5)
EOS PCT: 0 %
HCT: 44 % (ref 38.4–49.9)
HEMOGLOBIN: 15.1 g/dL (ref 13.0–17.1)
LYMPHS PCT: 5 %
Lymphs Abs: 0.7 10*3/uL — ABNORMAL LOW (ref 0.9–3.3)
MCH: 32.3 pg (ref 27.2–33.4)
MCHC: 34.3 g/dL (ref 32.0–36.0)
MCV: 94 fL (ref 79.3–98.0)
Monocytes Absolute: 0.2 10*3/uL (ref 0.1–0.9)
Monocytes Relative: 1 %
NEUTROS PCT: 94 %
Neutro Abs: 11.6 10*3/uL — ABNORMAL HIGH (ref 1.5–6.5)
PLATELETS: 200 10*3/uL (ref 140–400)
RBC: 4.68 MIL/uL (ref 4.20–5.82)
RDW: 12.2 % (ref 11.0–14.6)
WBC: 12.5 10*3/uL — AB (ref 4.0–10.3)

## 2018-02-22 MED ORDER — PREDNISONE 20 MG PO TABS: ORAL_TABLET | ORAL | 0 refills | Status: DC

## 2018-02-22 NOTE — Telephone Encounter (Signed)
Appointments scheduled AVS/Calendar printed per 7/29 los

## 2018-02-22 NOTE — Progress Notes (Addendum)
  [place] OFFICE PROGRESS NOTE   Diagnosis: ITP  INTERVAL HISTORY:   Andre Vega returns as scheduled.  Prednisone was tapered to 40 mg daily beginning 02/11/2018.  He denies any bleeding.  Previous bruises are resolving or have completely resolved.  No mouth discomfort.  He has a good appetite.  He reports he discontinued alcohol completely about 11 days ago.  Objective:  Vital signs in last 24 hours:  Blood pressure 127/82, pulse 83, temperature 97.8 F (36.6 C), temperature source Oral, resp. rate 19, height 5' 11"  (1.803 m), weight 206 lb 4.8 oz (93.6 kg), SpO2 97 %.    HEENT: No thrush. Resp: Lungs clear bilaterally. Cardio: Regular rate and rhythm. GI: Abdomen soft and nontender.  No hepatosplenomegaly. Vascular: No leg edema.  Skin: Resolving ecchymosis left upper arm.   Lab Results:  Lab Results  Component Value Date   WBC 12.5 (H) 02/22/2018   HGB 15.1 02/22/2018   HCT 44.0 02/22/2018   MCV 94.0 02/22/2018   PLT 200 02/22/2018   NEUTROABS 11.6 (H) 02/22/2018    Imaging:  No results found.  Medications: I have reviewed the patient's current medications.  Assessment/Plan: 1. Thrombocytopenia  02/04/2018 platelet count 19,000  Solu-Medrol 80 mg IV 02/04/2018  Prednisone 60 mg daily beginning 02/05/2018  02/08/2018 platelet count 45,000  Prednisone 40 mg daily beginning 02/11/2018  02/15/2018 platelet count 92,000  02/22/2018 platelet count 200,000  Prednisone 20 mg daily beginning 02/23/2018 2. History of moderate to heavy alcohol use 3. Malaise 4. 02/04/2018 mono screen negative; EBV IgG positive, EBV IgM negative     Disposition: Andre Vega appears unchanged.  He is currently taking prednisone 40 mg daily.  The platelet count is now in normal range.  He will taper the prednisone to 20 mg daily beginning 02/23/2018.  He will return for a follow-up CBC in 2 weeks.  He will return for lab and a follow-up visit in 4 weeks.  He will contact  the office in the interim with any problems.  Patient seen with Dr. Benay Spice.  Ned Card ANP/GNP-BC   02/22/2018  3:14 PM This was a shared visit with Ned Card.  Andre Vega has been maintained on prednisone since 02/05/2018.  The platelet count has normalized.  He appears to have ITP.  He will continue a slow prednisone taper.  Julieanne Manson, MD

## 2018-03-08 ENCOUNTER — Inpatient Hospital Stay: Payer: BC Managed Care – PPO | Attending: Nurse Practitioner

## 2018-03-08 DIAGNOSIS — F1011 Alcohol abuse, in remission: Secondary | ICD-10-CM | POA: Diagnosis not present

## 2018-03-08 DIAGNOSIS — D693 Immune thrombocytopenic purpura: Secondary | ICD-10-CM

## 2018-03-08 DIAGNOSIS — D696 Thrombocytopenia, unspecified: Secondary | ICD-10-CM | POA: Insufficient documentation

## 2018-03-08 DIAGNOSIS — R5381 Other malaise: Secondary | ICD-10-CM | POA: Diagnosis not present

## 2018-03-08 LAB — CBC WITH DIFFERENTIAL (CANCER CENTER ONLY)
BASOS ABS: 0 10*3/uL (ref 0.0–0.1)
BASOS PCT: 0 %
EOS PCT: 0 %
Eosinophils Absolute: 0 10*3/uL (ref 0.0–0.5)
HCT: 42.2 % (ref 38.4–49.9)
Hemoglobin: 14.6 g/dL (ref 13.0–17.1)
Lymphocytes Relative: 7 %
Lymphs Abs: 0.6 10*3/uL — ABNORMAL LOW (ref 0.9–3.3)
MCH: 31.9 pg (ref 27.2–33.4)
MCHC: 34.6 g/dL (ref 32.0–36.0)
MCV: 92.1 fL (ref 79.3–98.0)
MONO ABS: 0.2 10*3/uL (ref 0.1–0.9)
MONOS PCT: 3 %
NEUTROS ABS: 7.8 10*3/uL — AB (ref 1.5–6.5)
Neutrophils Relative %: 90 %
Platelet Count: 185 10*3/uL (ref 140–400)
RBC: 4.58 MIL/uL (ref 4.20–5.82)
RDW: 12 % (ref 11.0–14.6)
WBC Count: 8.7 10*3/uL (ref 4.0–10.3)

## 2018-03-09 ENCOUNTER — Telehealth: Payer: Self-pay | Admitting: Emergency Medicine

## 2018-03-09 ENCOUNTER — Telehealth: Payer: Self-pay

## 2018-03-09 DIAGNOSIS — D693 Immune thrombocytopenic purpura: Secondary | ICD-10-CM

## 2018-03-09 MED ORDER — PREDNISONE 5 MG PO TABS: ORAL_TABLET | ORAL | 0 refills | Status: DC

## 2018-03-09 NOTE — Telephone Encounter (Addendum)
Vm left for patient to call back regarding this note   ----- Message from Owens Shark, NP sent at 03/09/2018  9:06 AM EDT ----- Please let him know the platelet count remains in normal range.  Decrease prednisone to 15 mg daily.  Follow-up as scheduled.

## 2018-03-09 NOTE — Telephone Encounter (Signed)
-----   Message from Owens Shark, NP sent at 03/09/2018  9:06 AM EDT ----- Please let him know the platelet count remains in normal range.  Decrease prednisone to 15 mg daily.  Follow-up as scheduled.

## 2018-03-09 NOTE — Telephone Encounter (Signed)
Pt voiced understanding of message from telephone encounter below

## 2018-03-18 ENCOUNTER — Other Ambulatory Visit: Payer: Self-pay

## 2018-03-18 DIAGNOSIS — C9000 Multiple myeloma not having achieved remission: Secondary | ICD-10-CM

## 2018-03-19 ENCOUNTER — Inpatient Hospital Stay: Payer: BC Managed Care – PPO

## 2018-03-19 DIAGNOSIS — D696 Thrombocytopenia, unspecified: Secondary | ICD-10-CM | POA: Diagnosis not present

## 2018-03-19 DIAGNOSIS — C9000 Multiple myeloma not having achieved remission: Secondary | ICD-10-CM

## 2018-03-19 LAB — CBC WITH DIFFERENTIAL (CANCER CENTER ONLY)
Basophils Absolute: 0 10*3/uL (ref 0.0–0.1)
Basophils Relative: 0 %
Eosinophils Absolute: 0 10*3/uL (ref 0.0–0.5)
Eosinophils Relative: 0 %
HEMATOCRIT: 42.9 % (ref 38.4–49.9)
HEMOGLOBIN: 14.6 g/dL (ref 13.0–17.1)
LYMPHS ABS: 0.7 10*3/uL — AB (ref 0.9–3.3)
Lymphocytes Relative: 7 %
MCH: 31.7 pg (ref 27.2–33.4)
MCHC: 34.2 g/dL (ref 32.0–36.0)
MCV: 92.7 fL (ref 79.3–98.0)
MONO ABS: 0.2 10*3/uL (ref 0.1–0.9)
MONOS PCT: 3 %
NEUTROS ABS: 8.4 10*3/uL — AB (ref 1.5–6.5)
NEUTROS PCT: 90 %
Platelet Count: 210 10*3/uL (ref 140–400)
RBC: 4.62 MIL/uL (ref 4.20–5.82)
RDW: 12 % (ref 11.0–14.6)
WBC Count: 9.4 10*3/uL (ref 4.0–10.3)

## 2018-03-22 ENCOUNTER — Other Ambulatory Visit: Payer: BC Managed Care – PPO

## 2018-03-22 ENCOUNTER — Inpatient Hospital Stay (HOSPITAL_BASED_OUTPATIENT_CLINIC_OR_DEPARTMENT_OTHER): Payer: BC Managed Care – PPO | Admitting: Oncology

## 2018-03-22 ENCOUNTER — Telehealth: Payer: Self-pay

## 2018-03-22 VITALS — BP 127/82 | HR 74 | Temp 97.8°F | Resp 18 | Ht 71.0 in | Wt 206.7 lb

## 2018-03-22 DIAGNOSIS — R5381 Other malaise: Secondary | ICD-10-CM

## 2018-03-22 DIAGNOSIS — F1011 Alcohol abuse, in remission: Secondary | ICD-10-CM

## 2018-03-22 DIAGNOSIS — D693 Immune thrombocytopenic purpura: Secondary | ICD-10-CM

## 2018-03-22 DIAGNOSIS — D696 Thrombocytopenia, unspecified: Secondary | ICD-10-CM

## 2018-03-22 NOTE — Progress Notes (Signed)
  [place] OFFICE PROGRESS NOTE   Diagnosis: ITP  INTERVAL HISTORY:   Andre Vega returns as scheduled.  He has been maintained on prednisone at a dose of 15 mg daily for the past 2 weeks.  No bleeding.  He feels well.  He reports difficulty swallowing pills.  No other dysphasia.  Good appetite.  Arthralgias improved when he started prednisone.  He is no longer drinking alcohol.  Objective:  Vital signs in last 24 hours:  Blood pressure 127/82, pulse 74, temperature 97.8 F (36.6 C), temperature source Oral, resp. rate 18, height 5' 11"  (1.803 m), weight 206 lb 11.2 oz (93.8 kg), SpO2 98 %.    HEENT: No thrush Resp: Lungs clear bilaterally Cardio: Regular rate and rhythm GI: No hepatosplenomegaly Vascular: No leg edema    Lab Results:  Lab Results  Component Value Date   WBC 9.4 03/19/2018   HGB 14.6 03/19/2018   HCT 42.9 03/19/2018   MCV 92.7 03/19/2018   PLT 210 03/19/2018   NEUTROABS 8.4 (H) 03/19/2018    CMP  Lab Results  Component Value Date   NA 142 02/04/2018   K 4.2 02/04/2018   CL 107 02/04/2018   CO2 27 02/04/2018   GLUCOSE 138 (H) 02/04/2018   BUN 18 02/04/2018   CREATININE 1.05 02/04/2018   CALCIUM 9.2 02/04/2018   PROT 6.9 02/04/2018   ALBUMIN 4.1 02/04/2018   AST 32 02/04/2018   ALT 47 (H) 02/04/2018   ALKPHOS 58 02/04/2018   BILITOT 0.7 02/04/2018   GFRNONAA >60 02/04/2018   GFRAA >60 02/04/2018   Medications: I have reviewed the patient's current medications.   Assessment/Plan: 1. Thrombocytopenia  02/04/2018 platelet count 19,000  Solu-Medrol 80 mg IV 02/04/2018  Prednisone 60 mg daily beginning 02/05/2018  02/08/2018 platelet count 45,000  Prednisone 40 mg daily beginning 02/11/2018  02/15/2018 platelet count 92,000  02/22/2018 platelet count 200,000  Prednisone 20 mg daily beginning 02/23/2018  Prednisone 15 mg daily beginning 03/09/2018  Redness on 10 mg daily beginning 03/22/2018 2. History of moderate to  heavy alcohol use 3. Malaise 4. 02/04/2018 mono screen negative; EBV IgG positive, EBV IgM negative     Disposition: Andre Vega appears stable.  The platelet count is in the normal range.  We tapered the prednisone to 10 mg daily.  He will return for a CBC in 3 weeks and an office visit in 6 weeks.  15 minutes were spent with the patient today.  The majority of the time was used for counseling and coordination of care.  [name], MD  03/22/2018  8:40 AM

## 2018-03-22 NOTE — Telephone Encounter (Signed)
PRINTED AVS AND CALENDER OF UPCOMING APPOINTMENT. PER 8/26 LOS

## 2018-04-13 ENCOUNTER — Inpatient Hospital Stay: Payer: BC Managed Care – PPO | Attending: Nurse Practitioner

## 2018-04-13 DIAGNOSIS — D693 Immune thrombocytopenic purpura: Secondary | ICD-10-CM

## 2018-04-13 DIAGNOSIS — D696 Thrombocytopenia, unspecified: Secondary | ICD-10-CM | POA: Insufficient documentation

## 2018-04-13 LAB — CBC WITH DIFFERENTIAL (CANCER CENTER ONLY)
BASOS PCT: 0 %
Basophils Absolute: 0 10*3/uL (ref 0.0–0.1)
Eosinophils Absolute: 0.3 10*3/uL (ref 0.0–0.5)
Eosinophils Relative: 4 %
HEMATOCRIT: 42.7 % (ref 38.4–49.9)
HEMOGLOBIN: 14.5 g/dL (ref 13.0–17.1)
LYMPHS ABS: 2.2 10*3/uL (ref 0.9–3.3)
Lymphocytes Relative: 31 %
MCH: 31.7 pg (ref 27.2–33.4)
MCHC: 34 g/dL (ref 32.0–36.0)
MCV: 93.4 fL (ref 79.3–98.0)
MONO ABS: 0.7 10*3/uL (ref 0.1–0.9)
Monocytes Relative: 9 %
NEUTROS ABS: 4.1 10*3/uL (ref 1.5–6.5)
Neutrophils Relative %: 56 %
Platelet Count: 201 10*3/uL (ref 140–400)
RBC: 4.57 MIL/uL (ref 4.20–5.82)
RDW: 12.6 % (ref 11.0–14.6)
WBC Count: 7.2 10*3/uL (ref 4.0–10.3)

## 2018-04-13 LAB — CMP (CANCER CENTER ONLY)
ALK PHOS: 55 U/L (ref 38–126)
ALT: 25 U/L (ref 0–44)
ANION GAP: 9 (ref 5–15)
AST: 18 U/L (ref 15–41)
Albumin: 3.8 g/dL (ref 3.5–5.0)
BUN: 23 mg/dL (ref 8–23)
CALCIUM: 9.5 mg/dL (ref 8.9–10.3)
CHLORIDE: 107 mmol/L (ref 98–111)
CO2: 28 mmol/L (ref 22–32)
Creatinine: 1.12 mg/dL (ref 0.61–1.24)
GFR, Est AFR Am: >60 mL/min (ref >60)
GFR, Estimated: >60 mL/min (ref >60)
Glucose, Bld: 98 mg/dL (ref 70–99)
Potassium: 4.4 mmol/L (ref 3.5–5.1)
SODIUM: 144 mmol/L (ref 135–145)
Total Bilirubin: 0.4 mg/dL (ref 0.3–1.2)
Total Protein: 6.7 g/dL (ref 6.5–8.1)

## 2018-04-15 ENCOUNTER — Telehealth: Payer: Self-pay | Admitting: Emergency Medicine

## 2018-04-15 NOTE — Telephone Encounter (Addendum)
VM left for pt to call back regarding this note   ----- Message from Ladell Pier, MD sent at 04/13/2018  5:10 PM EDT ----- Please call patient, the platelet count is normal, decrease the prednisone to 10 mg daily, follow-up as scheduled

## 2018-04-16 ENCOUNTER — Telehealth: Payer: Self-pay | Admitting: Emergency Medicine

## 2018-04-16 NOTE — Telephone Encounter (Signed)
Pt verbalized understanding of previous note to decrease prednisone to 28m daily.

## 2018-04-23 ENCOUNTER — Other Ambulatory Visit: Payer: Self-pay | Admitting: Nurse Practitioner

## 2018-04-23 DIAGNOSIS — K7581 Nonalcoholic steatohepatitis (NASH): Secondary | ICD-10-CM

## 2018-05-03 ENCOUNTER — Inpatient Hospital Stay: Payer: BC Managed Care – PPO

## 2018-05-03 ENCOUNTER — Inpatient Hospital Stay: Payer: BC Managed Care – PPO | Attending: Nurse Practitioner | Admitting: Oncology

## 2018-05-03 ENCOUNTER — Telehealth: Payer: Self-pay | Admitting: Oncology

## 2018-05-03 ENCOUNTER — Other Ambulatory Visit: Payer: Self-pay | Admitting: *Deleted

## 2018-05-03 VITALS — BP 105/69 | HR 62 | Temp 97.9°F | Resp 19 | Ht 71.0 in | Wt 205.4 lb

## 2018-05-03 DIAGNOSIS — D696 Thrombocytopenia, unspecified: Secondary | ICD-10-CM | POA: Diagnosis not present

## 2018-05-03 DIAGNOSIS — R5381 Other malaise: Secondary | ICD-10-CM | POA: Diagnosis not present

## 2018-05-03 DIAGNOSIS — D693 Immune thrombocytopenic purpura: Secondary | ICD-10-CM

## 2018-05-03 LAB — CBC WITH DIFFERENTIAL (CANCER CENTER ONLY)
Basophils Absolute: 0.1 10*3/uL (ref 0.0–0.1)
Basophils Relative: 1 %
EOS PCT: 3 %
Eosinophils Absolute: 0.2 10*3/uL (ref 0.0–0.5)
HEMATOCRIT: 42.2 % (ref 38.4–49.9)
Hemoglobin: 14.6 g/dL (ref 13.0–17.1)
LYMPHS ABS: 1.9 10*3/uL (ref 0.9–3.3)
LYMPHS PCT: 25 %
MCH: 32.1 pg (ref 27.2–33.4)
MCHC: 34.7 g/dL (ref 32.0–36.0)
MCV: 92.7 fL (ref 79.3–98.0)
MONO ABS: 0.6 10*3/uL (ref 0.1–0.9)
MONOS PCT: 8 %
NEUTROS ABS: 4.9 10*3/uL (ref 1.5–6.5)
Neutrophils Relative %: 63 %
PLATELETS: 196 10*3/uL (ref 140–400)
RBC: 4.55 MIL/uL (ref 4.20–5.82)
RDW: 12.4 % (ref 11.0–14.6)
WBC Count: 7.8 10*3/uL (ref 4.0–10.3)

## 2018-05-03 MED ORDER — PREDNISONE 5 MG PO TABS: ORAL_TABLET | ORAL | 1 refills | Status: DC

## 2018-05-03 NOTE — Telephone Encounter (Signed)
Scheduled appt per 10/7 los - gave patient AVS and calender per los.  Pt request earliest time possible due to work

## 2018-05-03 NOTE — Progress Notes (Signed)
  [place] OFFICE PROGRESS NOTE   Diagnosis: ITP  INTERVAL HISTORY:   [name] turns as scheduled.  He continues prednisone at a dose of 10 mg daily.  He feels well.  He is not drinking alcohol.  He has been evaluated by hepatology and is scheduled for an ultrasound elastography this week.  Objective:  Vital signs in last 24 hours:  Blood pressure 105/69, pulse 62, temperature 97.9 F (36.6 C), temperature source Oral, resp. rate 19, height 5' 11"  (1.803 m), weight 205 lb 6.4 oz (93.2 kg), SpO2 100 %.    HEENT: No thrush Resp: Lungs clear bilaterally Cardio: Regular rate and rhythm GI: No hepatosplenomegaly Vascular: No leg edema   Lab Results:  Lab Results  Component Value Date   WBC 7.8 05/03/2018   HGB 14.6 05/03/2018   HCT 42.2 05/03/2018   MCV 92.7 05/03/2018   PLT 196 05/03/2018   NEUTROABS 4.9 05/03/2018    CMP  Lab Results  Component Value Date   NA 144 04/13/2018   K 4.4 04/13/2018   CL 107 04/13/2018   CO2 28 04/13/2018   GLUCOSE 98 04/13/2018   BUN 23 04/13/2018   CREATININE 1.12 04/13/2018   CALCIUM 9.5 04/13/2018   PROT 6.7 04/13/2018   ALBUMIN 3.8 04/13/2018   AST 18 04/13/2018   ALT 25 04/13/2018   ALKPHOS 55 04/13/2018   BILITOT 0.4 04/13/2018   GFRNONAA >60 04/13/2018   GFRAA >60 04/13/2018    Medications: I have reviewed the patient's current medications.   Assessment/Plan: 1. Thrombocytopenia  02/04/2018 platelet count 19,000  Solu-Medrol 80 mg IV 02/04/2018  Prednisone 60 mg daily beginning 02/05/2018  02/08/2018 platelet count 45,000  Prednisone 40 mg daily beginning 02/11/2018  02/15/2018 platelet count 92,000  02/22/2018 platelet count 200,000  Prednisone 20 mg daily beginning 02/23/2018  Prednisone 15 mg daily beginning 03/09/2018  Prednisone  10 mg daily beginning 03/22/2018  Prednisone 5 mg daily beginning 05/03/2018 2. History of moderate to heavy alcohol use 3. Malaise 4. 02/04/2018 mono screen  negative; EBV IgG positive, EBV IgM negative      Disposition: Mr. Arnette hears well.  The platelet count remains in the normal range.  We tapered the prednisone to 5 mg daily.  He will return for a CBC in 3 weeks and an office visit in 6 weeks.  He declined an influenza vaccine. 15 minutes were spent with the patient today.  The majority of the time was used for counseling and coordination of care.  [name], MD  05/03/2018  9:00 AM

## 2018-05-05 ENCOUNTER — Ambulatory Visit
Admission: RE | Admit: 2018-05-05 | Discharge: 2018-05-05 | Disposition: A | Discharge status: Home / Self Care | Payer: BC Managed Care – PPO | Source: Ambulatory Visit | Attending: Nurse Practitioner | Admitting: Nurse Practitioner

## 2018-05-05 ENCOUNTER — Other Ambulatory Visit: Payer: BC Managed Care – PPO

## 2018-05-05 DIAGNOSIS — K7581 Nonalcoholic steatohepatitis (NASH): Secondary | ICD-10-CM

## 2018-05-24 ENCOUNTER — Telehealth: Payer: Self-pay | Admitting: *Deleted

## 2018-05-24 ENCOUNTER — Inpatient Hospital Stay: Payer: BC Managed Care – PPO

## 2018-05-24 DIAGNOSIS — D696 Thrombocytopenia, unspecified: Secondary | ICD-10-CM | POA: Diagnosis not present

## 2018-05-24 DIAGNOSIS — D693 Immune thrombocytopenic purpura: Secondary | ICD-10-CM

## 2018-05-24 LAB — CBC WITH DIFFERENTIAL (CANCER CENTER ONLY)
Abs Immature Granulocytes: 0.02 10*3/uL (ref 0.00–0.07)
Basophils Absolute: 0.1 10*3/uL (ref 0.0–0.1)
Basophils Relative: 1 %
EOS ABS: 0.2 10*3/uL (ref 0.0–0.5)
EOS PCT: 3 %
HEMATOCRIT: 44.3 % (ref 39.0–52.0)
Hemoglobin: 15.1 g/dL (ref 13.0–17.0)
IMMATURE GRANULOCYTES: 0 %
LYMPHS ABS: 1.9 10*3/uL (ref 0.7–4.0)
LYMPHS PCT: 25 %
MCH: 31.5 pg (ref 26.0–34.0)
MCHC: 34.1 g/dL (ref 30.0–36.0)
MCV: 92.3 fL (ref 80.0–100.0)
Monocytes Absolute: 0.8 10*3/uL (ref 0.1–1.0)
Monocytes Relative: 10 %
NEUTROS PCT: 61 %
Neutro Abs: 4.5 10*3/uL (ref 1.7–7.7)
Platelet Count: 202 10*3/uL (ref 150–400)
RBC: 4.8 MIL/uL (ref 4.22–5.81)
RDW: 11.8 % (ref 11.5–15.5)
WBC: 7.5 10*3/uL (ref 4.0–10.5)
nRBC: 0 % (ref 0.0–0.2)

## 2018-05-24 NOTE — Telephone Encounter (Signed)
Left message for pt to call us back tomorrow for results.

## 2018-05-24 NOTE — Telephone Encounter (Signed)
-----   Message from Ladell Pier, MD sent at 05/24/2018  1:53 PM EDT ----- Please call patient, platelets are normal, change prednisone to 2.5 mg daily, follow-up as scheduled

## 2018-05-25 NOTE — Telephone Encounter (Signed)
Notified of message below

## 2018-05-25 NOTE — Telephone Encounter (Signed)
-----   Message from Ladell Pier, MD sent at 05/24/2018  1:53 PM EDT ----- Please call patient, platelets are normal, change prednisone to 2.5 mg daily, follow-up as scheduled

## 2018-06-14 ENCOUNTER — Telehealth: Payer: Self-pay | Admitting: Oncology

## 2018-06-14 ENCOUNTER — Inpatient Hospital Stay: Payer: BC Managed Care – PPO

## 2018-06-14 ENCOUNTER — Inpatient Hospital Stay: Payer: BC Managed Care – PPO | Attending: Nurse Practitioner | Admitting: Oncology

## 2018-06-14 VITALS — BP 109/71 | HR 63 | Temp 97.7°F | Resp 18 | Ht 71.0 in | Wt 206.3 lb

## 2018-06-14 DIAGNOSIS — D696 Thrombocytopenia, unspecified: Secondary | ICD-10-CM | POA: Diagnosis present

## 2018-06-14 DIAGNOSIS — D693 Immune thrombocytopenic purpura: Secondary | ICD-10-CM | POA: Diagnosis not present

## 2018-06-14 LAB — CBC WITH DIFFERENTIAL (CANCER CENTER ONLY)
Abs Immature Granulocytes: 0.02 10*3/uL (ref 0.00–0.07)
Basophils Absolute: 0.1 10*3/uL (ref 0.0–0.1)
Basophils Relative: 1 %
EOS PCT: 4 %
Eosinophils Absolute: 0.3 10*3/uL (ref 0.0–0.5)
HEMATOCRIT: 44.1 % (ref 39.0–52.0)
HEMOGLOBIN: 15.3 g/dL (ref 13.0–17.0)
Immature Granulocytes: 0 %
LYMPHS ABS: 2 10*3/uL (ref 0.7–4.0)
Lymphocytes Relative: 27 %
MCH: 32 pg (ref 26.0–34.0)
MCHC: 34.7 g/dL (ref 30.0–36.0)
MCV: 92.3 fL (ref 80.0–100.0)
MONO ABS: 0.8 10*3/uL (ref 0.1–1.0)
MONOS PCT: 11 %
Neutro Abs: 4.3 10*3/uL (ref 1.7–7.7)
Neutrophils Relative %: 57 %
Platelet Count: 196 10*3/uL (ref 150–400)
RBC: 4.78 MIL/uL (ref 4.22–5.81)
RDW: 11.5 % (ref 11.5–15.5)
WBC: 7.4 10*3/uL (ref 4.0–10.5)
nRBC: 0 % (ref 0.0–0.2)

## 2018-06-14 MED ORDER — PREDNISONE 2.5 MG PO TABS: 2.5000 mg | ORAL_TABLET | ORAL | 0 refills | Status: DC

## 2018-06-14 NOTE — Telephone Encounter (Signed)
Gave pt avs and calendar  °

## 2018-06-14 NOTE — Progress Notes (Signed)
  [place] OFFICE PROGRESS NOTE   Diagnosis: ITP  INTERVAL HISTORY:   Andre Vega returns as scheduled.  He tapered the prednisone to 2.5 mill grams daily beginning 05/24/2018.  He reports malaise with the prednisone taper.  No other complaint.  He is no longer drinking alcohol.  He reports intentional weight loss.  Objective:  Vital signs in last 24 hours:  Blood pressure 109/71, pulse 63, temperature 97.7 F (36.5 C), temperature source Oral, resp. rate 18, height 5' 11"  (1.803 m), weight 206 lb 4.8 oz (93.6 kg), SpO2 100 %.    HEENT: No thrush Resp: Lungs clear bilaterally Cardio: Regular rate and rhythm GI: No hepatosplenomegaly, nontender Vascular: No leg edema   Lab Results:  Lab Results  Component Value Date   WBC 7.4 06/14/2018   HGB 15.3 06/14/2018   HCT 44.1 06/14/2018   MCV 92.3 06/14/2018   PLT 196 06/14/2018   NEUTROABS 4.3 06/14/2018    CMP  Lab Results  Component Value Date   NA 144 04/13/2018   K 4.4 04/13/2018   CL 107 04/13/2018   CO2 28 04/13/2018   GLUCOSE 98 04/13/2018   BUN 23 04/13/2018   CREATININE 1.12 04/13/2018   CALCIUM 9.5 04/13/2018   PROT 6.7 04/13/2018   ALBUMIN 3.8 04/13/2018   AST 18 04/13/2018   ALT 25 04/13/2018   ALKPHOS 55 04/13/2018   BILITOT 0.4 04/13/2018   GFRNONAA >60 04/13/2018   GFRAA >60 04/13/2018   Medications: I have reviewed the patient's current medications.   Assessment/Plan: Assessment/Plan: 1. Thrombocytopenia  02/04/2018 platelet count 19,000  Solu-Medrol 80 mg IV 02/04/2018  Prednisone 60 mg daily beginning 02/05/2018  02/08/2018 platelet count 45,000  Prednisone 40 mg daily beginning 02/11/2018  02/15/2018 platelet count 92,000  02/22/2018 platelet count 200,000  Prednisone 20 mg daily beginning 02/23/2018  Prednisone 15 mg daily beginning 03/09/2018  Prednisone  10 mg daily beginning 03/22/2018  Prednisone 5 mg daily beginning 05/03/2018  Prednisone 2.5 mg daily  beginning 05/24/2018  Prednisone 2.5 mg every other day for 7 doses then stop beginning 06/14/2018 2. History of moderate to heavy alcohol use 3. Malaise 4. 02/04/2018 mono screen negative; EBV IgG positive, EBV IgM negative     Disposition: Mr. Rafferty appears stable.  The prednisone will be tapered to off over the next 2 weeks.  He will return for office visit and CBC on 07/12/2018.  15 minutes were spent with the patient today.  The majority of the time was used for counseling and coordination of care.  [name], MD  06/14/2018  8:37 AM

## 2018-07-08 ENCOUNTER — Telehealth: Payer: Self-pay | Admitting: *Deleted

## 2018-07-08 DIAGNOSIS — R5383 Other fatigue: Secondary | ICD-10-CM

## 2018-07-08 DIAGNOSIS — Z7952 Long term (current) use of systemic steroids: Secondary | ICD-10-CM

## 2018-07-08 NOTE — Telephone Encounter (Signed)
Called to request MD add labs on 07/12/18 visit to test for diabetes and thyroid problem. He has extreme fatigue and thinks he remembers Dr. Benay Spice saying these symptoms at last visit could be one of these things.

## 2018-07-08 NOTE — Telephone Encounter (Signed)
OK to add Hgb A1C and TSH to 12/16 labs.

## 2018-07-12 ENCOUNTER — Inpatient Hospital Stay: Payer: BC Managed Care – PPO | Attending: Nurse Practitioner

## 2018-07-12 ENCOUNTER — Telehealth: Payer: Self-pay | Admitting: Oncology

## 2018-07-12 ENCOUNTER — Inpatient Hospital Stay (HOSPITAL_BASED_OUTPATIENT_CLINIC_OR_DEPARTMENT_OTHER): Payer: BC Managed Care – PPO | Admitting: Oncology

## 2018-07-12 VITALS — BP 115/68 | HR 60 | Temp 97.8°F | Resp 17 | Ht 71.0 in | Wt 207.8 lb

## 2018-07-12 DIAGNOSIS — D693 Immune thrombocytopenic purpura: Secondary | ICD-10-CM

## 2018-07-12 DIAGNOSIS — Z7952 Long term (current) use of systemic steroids: Secondary | ICD-10-CM | POA: Diagnosis not present

## 2018-07-12 DIAGNOSIS — D696 Thrombocytopenia, unspecified: Secondary | ICD-10-CM

## 2018-07-12 DIAGNOSIS — R5383 Other fatigue: Secondary | ICD-10-CM

## 2018-07-12 LAB — CBC WITH DIFFERENTIAL (CANCER CENTER ONLY)
Abs Immature Granulocytes: 0.01 10*3/uL (ref 0.00–0.07)
Basophils Absolute: 0.1 10*3/uL (ref 0.0–0.1)
Basophils Relative: 1 %
Eosinophils Absolute: 0.3 10*3/uL (ref 0.0–0.5)
Eosinophils Relative: 4 %
HEMATOCRIT: 43.4 % (ref 39.0–52.0)
Hemoglobin: 15.1 g/dL (ref 13.0–17.0)
Immature Granulocytes: 0 %
LYMPHS ABS: 1.7 10*3/uL (ref 0.7–4.0)
Lymphocytes Relative: 29 %
MCH: 31.5 pg (ref 26.0–34.0)
MCHC: 34.8 g/dL (ref 30.0–36.0)
MCV: 90.4 fL (ref 80.0–100.0)
MONOS PCT: 12 %
Monocytes Absolute: 0.7 10*3/uL (ref 0.1–1.0)
Neutro Abs: 3.2 10*3/uL (ref 1.7–7.7)
Neutrophils Relative %: 54 %
Platelet Count: 182 10*3/uL (ref 150–400)
RBC: 4.8 MIL/uL (ref 4.22–5.81)
RDW: 11.5 % (ref 11.5–15.5)
WBC Count: 6 10*3/uL (ref 4.0–10.5)
nRBC: 0 % (ref 0.0–0.2)

## 2018-07-12 LAB — HEMOGLOBIN A1C
Hgb A1c MFr Bld: 5.2 % (ref 4.8–5.6)
Mean Plasma Glucose: 102.54 mg/dL

## 2018-07-12 LAB — TSH: TSH: 2.044 u[IU]/mL (ref 0.320–4.118)

## 2018-07-12 NOTE — Progress Notes (Signed)
  [place] [place] OFFICE PROGRESS NOTE   Diagnosis: ITP  INTERVAL HISTORY:   Andre Vega returns for a scheduled visit.  He is taking prednisone at a dose of 2.5 mg every other day.  We had planned for the prednisone to be tapered to off.  No bleeding.  He reports malaise.  He is scheduled for a hepatology appointment today.  He request we check a liver panel, TSH, and "diabetes "lab today.  He is no longer drinking alcohol.  Objective:  Vital signs in last 24 hours:  Blood pressure 115/68, pulse 60, temperature 97.8 F (36.6 C), temperature source Oral, resp. rate 17, height 5' 11"  (1.803 m), weight 207 lb 12.8 oz (94.3 kg), SpO2 98 %.    HEENT: No thrush Lymphatics: No cervical or supraclavicular nodes Resp: Lungs clear bilaterally Cardio: Regular rate and rhythm GI: No hepatosplenomegaly Vascular: No leg edema     Lab Results:  Lab Results  Component Value Date   WBC 6.0 07/12/2018   HGB 15.1 07/12/2018   HCT 43.4 07/12/2018   MCV 90.4 07/12/2018   PLT 182 07/12/2018   NEUTROABS 3.2 07/12/2018     Medications: I have reviewed the patient's current medications.   Assessment/Plan:  1. Thrombocytopenia  02/04/2018 platelet count 19,000  Solu-Medrol 80 mg IV 02/04/2018  Prednisone 60 mg daily beginning 02/05/2018  02/08/2018 platelet count 45,000  Prednisone 40 mg daily beginning 02/11/2018  02/15/2018 platelet count 92,000  02/22/2018 platelet count 200,000  Prednisone 20 mg daily beginning 02/23/2018  Prednisone 15 mg daily beginning 03/09/2018  Prednisone  10 mg daily beginning 03/22/2018  Prednisone 5 mg daily beginning 05/03/2018  Prednisone 2.5 mg daily beginning 05/24/2018  Prednisone 2.5 mg every other day for 7 doses then stop beginning 06/14/2018 2. History of moderate to heavy alcohol use 3. Malaise 4. 02/04/2018 mono screen negative; EBV IgG positive, EBV IgM negative     Disposition: Andre Vega is in clinical remission from ITP.   We had plan to taper the prednisone to off last month, but he has continued the every other day dose.  He reports having approximately 5 doses left.  He will complete the remaining doses and then discontinue prednisone.  He will return for an office and lab visit on 08/16/2018.  We obtained a TSH, hemoglobin A1c, and liver panel per his request.  We will forward these results to Dr. Stephanie Acre.  [name], MD  07/12/2018  10:25 AM

## 2018-07-12 NOTE — Telephone Encounter (Signed)
Gave patient avs report and appointments for January

## 2018-07-14 ENCOUNTER — Telehealth: Payer: Self-pay | Admitting: *Deleted

## 2018-07-14 NOTE — Telephone Encounter (Signed)
Patient called to request lab results from earlier in the week and reports Andre Vega with liver service did not see the CMP results.  Spoke with lab and the sample was discarded before the test was done. Called patient back with this information and instructed him to call if he wants to make a lab appointment to have this drawn again. Informed him his A1-C and TSH was normal. Copy being mailed to his home and copy faxed to Dr. Jonathon Jordan (PCP).

## 2018-07-14 NOTE — Telephone Encounter (Signed)
Called back and scheduled lab for 12/19 to have CMP drawn.

## 2018-07-15 ENCOUNTER — Inpatient Hospital Stay: Payer: BC Managed Care – PPO

## 2018-07-15 DIAGNOSIS — D696 Thrombocytopenia, unspecified: Secondary | ICD-10-CM

## 2018-07-15 DIAGNOSIS — D693 Immune thrombocytopenic purpura: Secondary | ICD-10-CM | POA: Diagnosis not present

## 2018-07-15 LAB — CMP (CANCER CENTER ONLY)
ALK PHOS: 66 U/L (ref 38–126)
ALT: 29 U/L (ref 0–44)
AST: 23 U/L (ref 15–41)
Albumin: 4 g/dL (ref 3.5–5.0)
Anion gap: 8 (ref 5–15)
BUN: 18 mg/dL (ref 8–23)
CO2: 24 mmol/L (ref 22–32)
CREATININE: 0.98 mg/dL (ref 0.61–1.24)
Calcium: 9 mg/dL (ref 8.9–10.3)
Chloride: 109 mmol/L (ref 98–111)
GFR, Est AFR Am: >60 mL/min (ref >60)
GFR, Estimated: >60 mL/min (ref >60)
Glucose, Bld: 100 mg/dL — ABNORMAL HIGH (ref 70–99)
Potassium: 4.1 mmol/L (ref 3.5–5.1)
Sodium: 141 mmol/L (ref 135–145)
TOTAL PROTEIN: 6.7 g/dL (ref 6.5–8.1)
Total Bilirubin: 0.5 mg/dL (ref 0.3–1.2)

## 2018-07-15 LAB — CBC WITH DIFFERENTIAL (CANCER CENTER ONLY)
Abs Immature Granulocytes: 0.01 10*3/uL (ref 0.00–0.07)
Basophils Absolute: 0 10*3/uL (ref 0.0–0.1)
Basophils Relative: 1 %
EOS ABS: 0.2 10*3/uL (ref 0.0–0.5)
EOS PCT: 4 %
HCT: 44 % (ref 39.0–52.0)
Hemoglobin: 15.3 g/dL (ref 13.0–17.0)
Immature Granulocytes: 0 %
Lymphocytes Relative: 20 %
Lymphs Abs: 1.3 10*3/uL (ref 0.7–4.0)
MCH: 30.7 pg (ref 26.0–34.0)
MCHC: 34.8 g/dL (ref 30.0–36.0)
MCV: 88.4 fL (ref 80.0–100.0)
Monocytes Absolute: 0.6 10*3/uL (ref 0.1–1.0)
Monocytes Relative: 9 %
Neutro Abs: 4.3 10*3/uL (ref 1.7–7.7)
Neutrophils Relative %: 66 %
Platelet Count: 196 10*3/uL (ref 150–400)
RBC: 4.98 MIL/uL (ref 4.22–5.81)
RDW: 11.6 % (ref 11.5–15.5)
WBC Count: 6.5 10*3/uL (ref 4.0–10.5)
nRBC: 0 % (ref 0.0–0.2)

## 2018-07-16 ENCOUNTER — Telehealth: Payer: Self-pay | Admitting: *Deleted

## 2018-07-16 NOTE — Telephone Encounter (Signed)
Left patient VM that CMP was normal and copy being mailed to him. Had also called [facility] and left message for Heide Spark, NP that results are in Epic for her review.

## 2018-08-12 ENCOUNTER — Other Ambulatory Visit: Payer: Self-pay | Admitting: *Deleted

## 2018-08-12 DIAGNOSIS — D696 Thrombocytopenia, unspecified: Secondary | ICD-10-CM

## 2018-08-16 ENCOUNTER — Inpatient Hospital Stay: Payer: BC Managed Care – PPO | Attending: Nurse Practitioner | Admitting: Oncology

## 2018-08-16 ENCOUNTER — Telehealth: Payer: Self-pay | Admitting: Oncology

## 2018-08-16 ENCOUNTER — Inpatient Hospital Stay: Payer: BC Managed Care – PPO

## 2018-08-16 VITALS — BP 115/72 | HR 59 | Temp 97.7°F | Resp 18 | Ht 71.0 in | Wt 205.1 lb

## 2018-08-16 DIAGNOSIS — D696 Thrombocytopenia, unspecified: Secondary | ICD-10-CM

## 2018-08-16 DIAGNOSIS — D693 Immune thrombocytopenic purpura: Secondary | ICD-10-CM | POA: Diagnosis not present

## 2018-08-16 LAB — CBC WITH DIFFERENTIAL (CANCER CENTER ONLY)
Abs Immature Granulocytes: 0.01 K/uL (ref 0.00–0.07)
Basophils Absolute: 0 K/uL (ref 0.0–0.1)
Basophils Relative: 1 %
Eosinophils Absolute: 0.2 K/uL (ref 0.0–0.5)
Eosinophils Relative: 3 %
HCT: 43.1 % (ref 39.0–52.0)
Hemoglobin: 14.8 g/dL (ref 13.0–17.0)
Immature Granulocytes: 0 %
Lymphocytes Relative: 24 %
Lymphs Abs: 1.6 K/uL (ref 0.7–4.0)
MCH: 31 pg (ref 26.0–34.0)
MCHC: 34.3 g/dL (ref 30.0–36.0)
MCV: 90.2 fL (ref 80.0–100.0)
Monocytes Absolute: 0.7 K/uL (ref 0.1–1.0)
Monocytes Relative: 11 %
Neutro Abs: 4 K/uL (ref 1.7–7.7)
Neutrophils Relative %: 61 %
Platelet Count: 190 K/uL (ref 150–400)
RBC: 4.78 MIL/uL (ref 4.22–5.81)
RDW: 11.9 % (ref 11.5–15.5)
WBC Count: 6.5 K/uL (ref 4.0–10.5)
nRBC: 0 % (ref 0.0–0.2)

## 2018-08-16 NOTE — Telephone Encounter (Signed)
Printed calendar and avs. °

## 2018-08-16 NOTE — Progress Notes (Signed)
  [place] OFFICE PROGRESS NOTE   Diagnosis: ITP  INTERVAL HISTORY:   Mr. Westenberger returns as scheduled.  He reports the last dose of prednisone was on 07/27/2018.  He generally feels well.  He is working.  Mild malaise.  Objective:  Vital signs in last 24 hours:  Blood pressure 115/72, pulse (!) 59, temperature 97.7 F (36.5 C), temperature source Oral, resp. rate 18, height 5' 11"  (1.803 m), weight 205 lb 1.6 oz (93 kg), SpO2 98 %.    Lymphatics: No cervical or supraclavicular nodes Resp: Lungs clear bilaterally Cardio: Regular rate and rhythm GI: No hepatosplenomegaly Vascular: No leg edema   Lab Results:  Lab Results  Component Value Date   WBC 6.5 08/16/2018   HGB 14.8 08/16/2018   HCT 43.1 08/16/2018   MCV 90.2 08/16/2018   PLT 190 08/16/2018   NEUTROABS 4.0 08/16/2018    CMP  Lab Results  Component Value Date   NA 141 07/15/2018   K 4.1 07/15/2018   CL 109 07/15/2018   CO2 24 07/15/2018   GLUCOSE 100 (H) 07/15/2018   BUN 18 07/15/2018   CREATININE 0.98 07/15/2018   CALCIUM 9.0 07/15/2018   PROT 6.7 07/15/2018   ALBUMIN 4.0 07/15/2018   AST 23 07/15/2018   ALT 29 07/15/2018   ALKPHOS 66 07/15/2018   BILITOT 0.5 07/15/2018   GFRNONAA >60 07/15/2018   GFRAA >60 07/15/2018    Medications: I have reviewed the patient's current medications.   Assessment/Plan: 1. Thrombocytopenia  02/04/2018 platelet count 19,000  Solu-Medrol 80 mg IV 02/04/2018  Prednisone 60 mg daily beginning 02/05/2018  02/08/2018 platelet count 45,000  Prednisone 40 mg daily beginning 02/11/2018  02/15/2018 platelet count 92,000  02/22/2018 platelet count 200,000  Prednisone 20 mg daily beginning 02/23/2018  Prednisone 15 mg daily beginning 03/09/2018  Prednisone  10 mg daily beginning 03/22/2018  Prednisone 5 mg daily beginning 05/03/2018  Prednisone 2.5 mg daily beginning 05/24/2018  Prednisone 2.5 mg every other day, taper to off 07/27/2018 2. History  of moderate to heavy alcohol use 3. Malaise 4. 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  He will return for a CBC in approximately 6 weeks and an office visit in 2-4 months.  He will contact us for spontaneous bleeding or bruising.  15 minutes were spent with the patient today.  The majority of the time was used for counseling and coordination of care.  [name], MD  08/16/2018  9:35 AM

## 2018-09-27 ENCOUNTER — Inpatient Hospital Stay: Payer: BC Managed Care – PPO | Attending: Nurse Practitioner

## 2018-09-27 DIAGNOSIS — D696 Thrombocytopenia, unspecified: Secondary | ICD-10-CM

## 2018-09-27 DIAGNOSIS — D693 Immune thrombocytopenic purpura: Secondary | ICD-10-CM | POA: Insufficient documentation

## 2018-09-27 LAB — CBC WITH DIFFERENTIAL (CANCER CENTER ONLY)
Abs Immature Granulocytes: 0.02 10*3/uL (ref 0.00–0.07)
Basophils Absolute: 0 10*3/uL (ref 0.0–0.1)
Basophils Relative: 1 %
Eosinophils Absolute: 0.3 10*3/uL (ref 0.0–0.5)
Eosinophils Relative: 4 %
HEMATOCRIT: 44.9 % (ref 39.0–52.0)
Hemoglobin: 15.3 g/dL (ref 13.0–17.0)
Immature Granulocytes: 0 %
Lymphocytes Relative: 25 %
Lymphs Abs: 1.7 10*3/uL (ref 0.7–4.0)
MCH: 30.7 pg (ref 26.0–34.0)
MCHC: 34.1 g/dL (ref 30.0–36.0)
MCV: 90.2 fL (ref 80.0–100.0)
MONO ABS: 0.8 10*3/uL (ref 0.1–1.0)
MONOS PCT: 12 %
Neutro Abs: 4.1 10*3/uL (ref 1.7–7.7)
Neutrophils Relative %: 58 %
Platelet Count: 201 10*3/uL (ref 150–400)
RBC: 4.98 MIL/uL (ref 4.22–5.81)
RDW: 11.8 % (ref 11.5–15.5)
WBC Count: 6.9 10*3/uL (ref 4.0–10.5)
nRBC: 0 % (ref 0.0–0.2)

## 2018-12-13 ENCOUNTER — Telehealth: Payer: Self-pay | Admitting: Oncology

## 2018-12-13 ENCOUNTER — Inpatient Hospital Stay: Payer: BC Managed Care – PPO | Attending: Nurse Practitioner | Admitting: Oncology

## 2018-12-13 ENCOUNTER — Other Ambulatory Visit: Payer: Self-pay

## 2018-12-13 ENCOUNTER — Inpatient Hospital Stay: Payer: BC Managed Care – PPO

## 2018-12-13 VITALS — BP 123/68 | HR 74 | Temp 98.2°F | Resp 18 | Ht 71.0 in | Wt 205.1 lb

## 2018-12-13 DIAGNOSIS — D696 Thrombocytopenia, unspecified: Secondary | ICD-10-CM

## 2018-12-13 DIAGNOSIS — D693 Immune thrombocytopenic purpura: Secondary | ICD-10-CM | POA: Diagnosis present

## 2018-12-13 LAB — CBC WITH DIFFERENTIAL (CANCER CENTER ONLY)
Abs Immature Granulocytes: 0 10*3/uL (ref 0.00–0.07)
Basophils Absolute: 0 10*3/uL (ref 0.0–0.1)
Basophils Relative: 1 %
Eosinophils Absolute: 0.2 10*3/uL (ref 0.0–0.5)
Eosinophils Relative: 4 %
HCT: 44.3 % (ref 39.0–52.0)
Hemoglobin: 15.1 g/dL (ref 13.0–17.0)
Immature Granulocytes: 0 %
Lymphocytes Relative: 26 %
Lymphs Abs: 1.5 10*3/uL (ref 0.7–4.0)
MCH: 30.8 pg (ref 26.0–34.0)
MCHC: 34.1 g/dL (ref 30.0–36.0)
MCV: 90.4 fL (ref 80.0–100.0)
Monocytes Absolute: 0.6 10*3/uL (ref 0.1–1.0)
Monocytes Relative: 10 %
Neutro Abs: 3.4 10*3/uL (ref 1.7–7.7)
Neutrophils Relative %: 59 %
Platelet Count: 194 10*3/uL (ref 150–400)
RBC: 4.9 MIL/uL (ref 4.22–5.81)
RDW: 12.2 % (ref 11.5–15.5)
WBC Count: 5.7 10*3/uL (ref 4.0–10.5)
nRBC: 0 % (ref 0.0–0.2)

## 2018-12-13 NOTE — Progress Notes (Signed)
  [name]   Diagnosis: ITP  INTERVAL HISTORY:   Andre Vega returns as scheduled.  He feels well.  No bleeding.  He has discomfort at the posterior neck and hand joints.  He walks 6 miles daily and does approximately 130 push-ups daily.  The discomfort improves with ibuprofen.  He takes ibuprofen (400 mg) every few days.  Objective:  Vital signs in last 24 hours:  Blood pressure 123/68, pulse 74, temperature 98.2 F (36.8 C), temperature source Oral, resp. rate 18, height 5' 11"  (1.803 m), weight 205 lb 1.6 oz (93 kg), SpO2 100 %.    HEENT: Neck without mass Lymphatics: No cervical or supraclavicular nodes Vascular: No leg edema GI: No hepatosplenomegaly     Lab Results:  Lab Results  Component Value Date   WBC 5.7 12/13/2018   HGB 15.1 12/13/2018   HCT 44.3 12/13/2018   MCV 90.4 12/13/2018   PLT 194 12/13/2018   NEUTROABS 3.4 12/13/2018    CMP  Lab Results  Component Value Date   NA 141 07/15/2018   K 4.1 07/15/2018   CL 109 07/15/2018   CO2 24 07/15/2018   GLUCOSE 100 (H) 07/15/2018   BUN 18 07/15/2018   CREATININE 0.98 07/15/2018   CALCIUM 9.0 07/15/2018   PROT 6.7 07/15/2018   ALBUMIN 4.0 07/15/2018   AST 23 07/15/2018   ALT 29 07/15/2018   ALKPHOS 66 07/15/2018   BILITOT 0.5 07/15/2018   GFRNONAA >60 07/15/2018   GFRAA >60 07/15/2018    Medications: I have reviewed the patient's current medications.   Assessment/Plan: 1. Thrombocytopenia  02/04/2018 platelet count 19,000  Solu-Medrol 80 mg IV 02/04/2018  Prednisone 60 mg daily beginning 02/05/2018  02/08/2018 platelet count 45,000  Prednisone 40 mg daily beginning 02/11/2018  02/15/2018 platelet count 92,000  02/22/2018 platelet count 200,000  Prednisone 20 mg daily beginning 02/23/2018  Prednisone 15 mg daily beginning 03/09/2018  Prednisone  10 mg daily beginning 03/22/2018  Prednisone 5 mg daily beginning 05/03/2018  Prednisone 2.5 mg daily  beginning 05/24/2018  Prednisone 2.5 mg every other day, taper to off 07/27/2018 2. History of moderate to heavy alcohol use 3. Malaise 4. 02/04/2018 mono screen negative; EBV IgG positive, EBV IgM negative    Disposition: Andre Vega remains in clinical remission from ITP.  He will return for office visit in 4 months.  He will use ibuprofen or Aleve as needed, and follow-up with his primary provider for evaluation and treatment of the musculoskeletal pain.  [name], MD  12/13/2018  5:21 PM

## 2018-12-13 NOTE — Telephone Encounter (Signed)
Scheduled appt per 5/18 los  A calendar will be mailed out.

## 2018-12-15 ENCOUNTER — Telehealth: Payer: Self-pay | Admitting: Oncology

## 2018-12-15 NOTE — Telephone Encounter (Signed)
Per sch msg, patient requested 9/17 be moved to 9/22 at 11:30. Moved appt to 11 on 9/22 due to no availability for MD for specific time requested. Called patient. Left message.

## 2019-04-14 ENCOUNTER — Other Ambulatory Visit: Payer: BC Managed Care – PPO

## 2019-04-14 ENCOUNTER — Ambulatory Visit: Payer: BC Managed Care – PPO | Admitting: Oncology

## 2019-04-19 ENCOUNTER — Inpatient Hospital Stay (HOSPITAL_BASED_OUTPATIENT_CLINIC_OR_DEPARTMENT_OTHER): Payer: BC Managed Care – PPO | Admitting: Oncology

## 2019-04-19 ENCOUNTER — Inpatient Hospital Stay: Payer: BC Managed Care – PPO | Attending: Oncology

## 2019-04-19 ENCOUNTER — Other Ambulatory Visit: Payer: Self-pay

## 2019-04-19 ENCOUNTER — Telehealth: Payer: Self-pay | Admitting: Oncology

## 2019-04-19 VITALS — BP 113/81 | HR 68 | Temp 97.8°F | Resp 18 | Ht 71.0 in | Wt 201.0 lb

## 2019-04-19 DIAGNOSIS — D693 Immune thrombocytopenic purpura: Secondary | ICD-10-CM | POA: Insufficient documentation

## 2019-04-19 DIAGNOSIS — M7989 Other specified soft tissue disorders: Secondary | ICD-10-CM | POA: Insufficient documentation

## 2019-04-19 DIAGNOSIS — D696 Thrombocytopenia, unspecified: Secondary | ICD-10-CM | POA: Diagnosis not present

## 2019-04-19 LAB — CBC WITH DIFFERENTIAL (CANCER CENTER ONLY)
Abs Immature Granulocytes: 0.01 10*3/uL (ref 0.00–0.07)
Basophils Absolute: 0 10*3/uL (ref 0.0–0.1)
Basophils Relative: 1 %
Eosinophils Absolute: 0.2 10*3/uL (ref 0.0–0.5)
Eosinophils Relative: 4 %
HCT: 44.9 % (ref 39.0–52.0)
Hemoglobin: 15.6 g/dL (ref 13.0–17.0)
Immature Granulocytes: 0 %
Lymphocytes Relative: 24 %
Lymphs Abs: 1.4 10*3/uL (ref 0.7–4.0)
MCH: 31.6 pg (ref 26.0–34.0)
MCHC: 34.7 g/dL (ref 30.0–36.0)
MCV: 90.9 fL (ref 80.0–100.0)
Monocytes Absolute: 0.6 10*3/uL (ref 0.1–1.0)
Monocytes Relative: 11 %
Neutro Abs: 3.6 10*3/uL (ref 1.7–7.7)
Neutrophils Relative %: 60 %
Platelet Count: 204 10*3/uL (ref 150–400)
RBC: 4.94 MIL/uL (ref 4.22–5.81)
RDW: 12.1 % (ref 11.5–15.5)
WBC Count: 5.8 10*3/uL (ref 4.0–10.5)
nRBC: 0 % (ref 0.0–0.2)

## 2019-04-19 NOTE — Progress Notes (Signed)
  [place] OFFICE PROGRESS NOTE   Diagnosis: ITP  INTERVAL HISTORY:   [name] returns as scheduled.  He feels well.  He is exercising.  He has noted discomfort at the distal toes, chiefly in the evening.  He reports discoloration and swelling of the right third toe for the past several days.  Objective:  Vital signs in last 24 hours:  Blood pressure 113/81, pulse 68, temperature 97.8 F (36.6 C), temperature source Oral, resp. rate 18, height 5' 11"  (1.803 m), weight 201 lb (91.2 kg), SpO2 100 %.    Limited physical examination secondary to distancing with the COVID pandemic GI: No hepatosplenomegaly Vascular: No leg edema Musculoskeletal: Mild erythema and swelling throughout the right third toe.,  No fluctuance.Marland Kitchen  No erythema or swelling in the hand, elbow, or knee joints     Lab Results:  Lab Results  Component Value Date   WBC 5.8 04/19/2019   HGB 15.6 04/19/2019   HCT 44.9 04/19/2019   MCV 90.9 04/19/2019   PLT 204 04/19/2019   NEUTROABS 3.6 04/19/2019    CMP  Lab Results  Component Value Date   NA 141 07/15/2018   K 4.1 07/15/2018   CL 109 07/15/2018   CO2 24 07/15/2018   GLUCOSE 100 (H) 07/15/2018   BUN 18 07/15/2018   CREATININE 0.98 07/15/2018   CALCIUM 9.0 07/15/2018   PROT 6.7 07/15/2018   ALBUMIN 4.0 07/15/2018   AST 23 07/15/2018   ALT 29 07/15/2018   ALKPHOS 66 07/15/2018   BILITOT 0.5 07/15/2018   GFRNONAA >60 07/15/2018   GFRAA >60 07/15/2018     Medications: I have reviewed the patient's current medications.   Assessment/Plan:  1. Thrombocytopenia  02/04/2018 platelet count 19,000  Solu-Medrol 80 mg IV 02/04/2018  Prednisone 60 mg daily beginning 02/05/2018  02/08/2018 platelet count 45,000  Prednisone 40 mg daily beginning 02/11/2018  02/15/2018 platelet count 92,000  02/22/2018 platelet count 200,000  Prednisone 20 mg daily beginning 02/23/2018  Prednisone 15 mg daily beginning 03/09/2018  Prednisone  10  mg daily beginning 03/22/2018  Prednisone 5 mg daily beginning 05/03/2018  Prednisone 2.5 mg daily beginning 05/24/2018  Prednisone 2.5 mg every other day, taper to off 07/27/2018 2. History of moderate to heavy alcohol use 3. Malaise 4. 02/04/2018 mono screen negative; EBV IgG positive, EBV IgM negative    Disposition: Andre Vega is in remission from ITP.  He would like to continue close follow-up in the hematology clinic.  He will return for an office visit and CBC in 3 months.  He has mild swelling and erythema of the right third toe.  This may be related to an injury or arthritis.  This will be an unusual presentation for gout.  He will follow-up with Dr. Stephanie Vega if the toe does not improve over the next few days.  [name], MD  04/19/2019  2:16 PM

## 2019-04-19 NOTE — Telephone Encounter (Signed)
Scheduled appt per 9/22 los.  Printed calendar and avs.

## 2019-04-29 ENCOUNTER — Other Ambulatory Visit: Payer: Self-pay | Admitting: Nurse Practitioner

## 2019-05-02 ENCOUNTER — Other Ambulatory Visit: Payer: Self-pay | Admitting: Nurse Practitioner

## 2019-05-02 DIAGNOSIS — K760 Fatty (change of) liver, not elsewhere classified: Secondary | ICD-10-CM

## 2019-05-06 ENCOUNTER — Ambulatory Visit
Admission: RE | Admit: 2019-05-06 | Discharge: 2019-05-06 | Disposition: A | Discharge status: Home / Self Care | Payer: BC Managed Care – PPO | Source: Ambulatory Visit | Attending: Nurse Practitioner | Admitting: Nurse Practitioner

## 2019-05-06 DIAGNOSIS — K760 Fatty (change of) liver, not elsewhere classified: Secondary | ICD-10-CM

## 2019-07-12 ENCOUNTER — Inpatient Hospital Stay: Payer: BC Managed Care – PPO | Admitting: Oncology

## 2019-07-12 ENCOUNTER — Inpatient Hospital Stay: Payer: BC Managed Care – PPO | Attending: Oncology

## 2019-07-12 ENCOUNTER — Telehealth: Payer: Self-pay | Admitting: Oncology

## 2019-07-12 ENCOUNTER — Other Ambulatory Visit: Payer: Self-pay

## 2019-07-12 VITALS — BP 122/76 | HR 71 | Temp 97.9°F | Resp 17 | Ht 71.0 in | Wt 201.9 lb

## 2019-07-12 DIAGNOSIS — D696 Thrombocytopenia, unspecified: Secondary | ICD-10-CM

## 2019-07-12 DIAGNOSIS — D693 Immune thrombocytopenic purpura: Secondary | ICD-10-CM | POA: Insufficient documentation

## 2019-07-12 LAB — CBC WITH DIFFERENTIAL (CANCER CENTER ONLY)
Abs Immature Granulocytes: 0.01 10*3/uL (ref 0.00–0.07)
Basophils Absolute: 0 10*3/uL (ref 0.0–0.1)
Basophils Relative: 1 %
Eosinophils Absolute: 0.2 10*3/uL (ref 0.0–0.5)
Eosinophils Relative: 2 %
HCT: 47.5 % (ref 39.0–52.0)
Hemoglobin: 16.6 g/dL (ref 13.0–17.0)
Immature Granulocytes: 0 %
Lymphocytes Relative: 24 %
Lymphs Abs: 1.6 10*3/uL (ref 0.7–4.0)
MCH: 31.6 pg (ref 26.0–34.0)
MCHC: 34.9 g/dL (ref 30.0–36.0)
MCV: 90.3 fL (ref 80.0–100.0)
Monocytes Absolute: 0.6 10*3/uL (ref 0.1–1.0)
Monocytes Relative: 9 %
Neutro Abs: 4.1 10*3/uL (ref 1.7–7.7)
Neutrophils Relative %: 64 %
Platelet Count: 207 10*3/uL (ref 150–400)
RBC: 5.26 MIL/uL (ref 4.22–5.81)
RDW: 11.8 % (ref 11.5–15.5)
WBC Count: 6.4 10*3/uL (ref 4.0–10.5)
nRBC: 0 % (ref 0.0–0.2)

## 2019-07-12 NOTE — Telephone Encounter (Signed)
Scheduled appt per 12/15 los.  Printed calendar and avs..

## 2019-07-12 NOTE — Progress Notes (Signed)
  [place] OFFICE PROGRESS NOTE   Diagnosis: ITP  INTERVAL HISTORY:   Andre Vega returns for a scheduled visit.  He feels well.  He is exercising.  He reports "stiffness "in the right hand.  He has mild neck discomfort.  He has relocated to [place].  No fever, rash, or joint swelling. A liver ultrasound on 05/06/2019 revealed changes of hepatic steatosis and stable hepatic cysts. Objective:  Vital signs in last 24 hours:  Blood pressure 122/76, pulse 71, temperature 97.9 F (36.6 C), temperature source Temporal, resp. rate 17, height 5' 11"  (1.803 m), weight 201 lb 14.4 oz (91.6 kg), SpO2 100 %.    HEENT: Neck without mass Lymphatics: No cervical or supraclavicular nodes  GI: No hepatosplenomegaly Vascular: No leg edema   Lab Results:  Lab Results  Component Value Date   WBC 6.4 07/12/2019   HGB 16.6 07/12/2019   HCT 47.5 07/12/2019   MCV 90.3 07/12/2019   PLT 207 07/12/2019   NEUTROABS 4.1 07/12/2019    CMP  Lab Results  Component Value Date   NA 141 07/15/2018   K 4.1 07/15/2018   CL 109 07/15/2018   CO2 24 07/15/2018   GLUCOSE 100 (H) 07/15/2018   BUN 18 07/15/2018   CREATININE 0.98 07/15/2018   CALCIUM 9.0 07/15/2018   PROT 6.7 07/15/2018   ALBUMIN 4.0 07/15/2018   AST 23 07/15/2018   ALT 29 07/15/2018   ALKPHOS 66 07/15/2018   BILITOT 0.5 07/15/2018   GFRNONAA >60 07/15/2018   GFRAA >60 07/15/2018    Medications: I have reviewed the patient's current medications.   Assessment/Plan: 1. Thrombocytopenia  02/04/2018 platelet count 19,000  Solu-Medrol 80 mg IV 02/04/2018  Prednisone 60 mg daily beginning 02/05/2018  02/08/2018 platelet count 45,000  Prednisone 40 mg daily beginning 02/11/2018  02/15/2018 platelet count 92,000  02/22/2018 platelet count 200,000  Prednisone 20 mg daily beginning 02/23/2018  Prednisone 15 mg daily beginning 03/09/2018  Prednisone  10 mg daily beginning 03/22/2018  Prednisone 5 mg daily beginning  05/03/2018  Prednisone 2.5 mg daily beginning 05/24/2018  Prednisone 2.5 mg every other day, taper to off 07/27/2018 2. History of moderate to heavy alcohol use 3. Malaise 4. 02/04/2018 mono screen negative; EBV IgG positive, EBV IgM negative    Disposition: He remains in clinical remission from ITP.  He would like to return for an office and lab visit in April 2021.  We discussed the idiopathic nature of his ITP.  There is no apparent predisposing condition for ITP in his case.  Andre Coder, MD  07/12/2019  3:05 PM

## 2019-09-05 ENCOUNTER — Ambulatory Visit: Payer: BC Managed Care – PPO

## 2019-09-06 IMAGING — US US ABDOMEN COMPLETE
1 series · 14 of 25 positions shown · non-contrast
Comparison: 02/18/2016 aortic ultrasound

CLINICAL DATA: 67 y/o  M; distended abdomen and thrombocytopenia.

EXAM:
ABDOMEN ULTRASOUND COMPLETE

[Series 1: us abdomen complete · 14 of 114 slices shown]
[im 1/114]
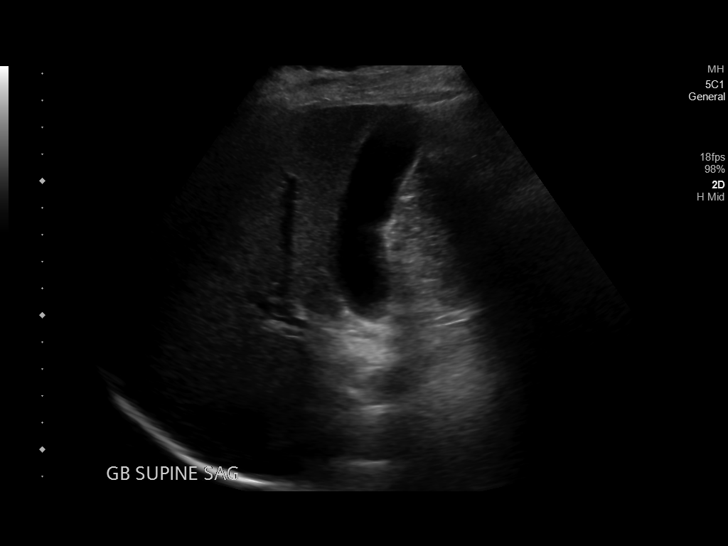
[im 10/114]
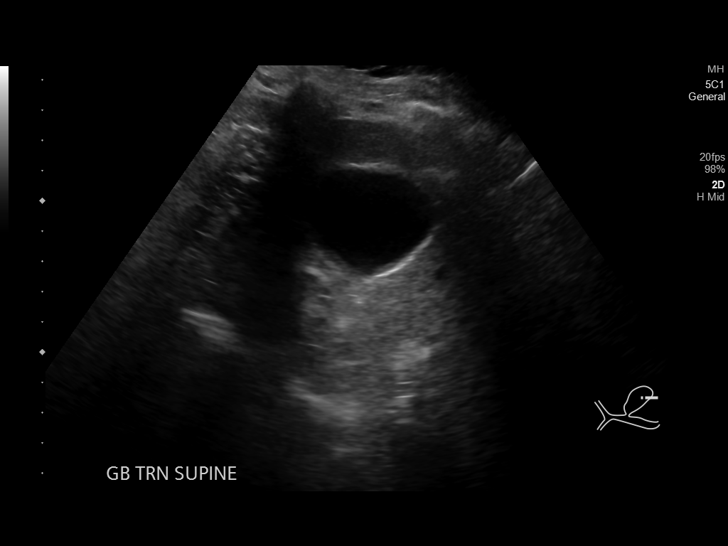
[im 19/114]
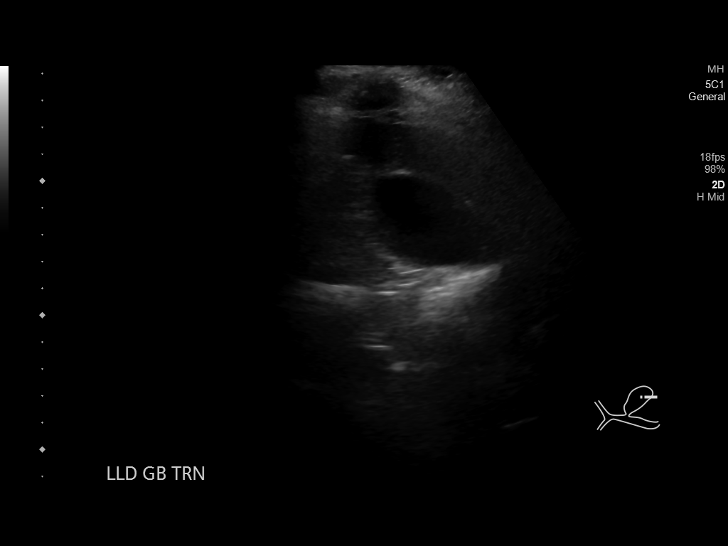
[im 29/114]
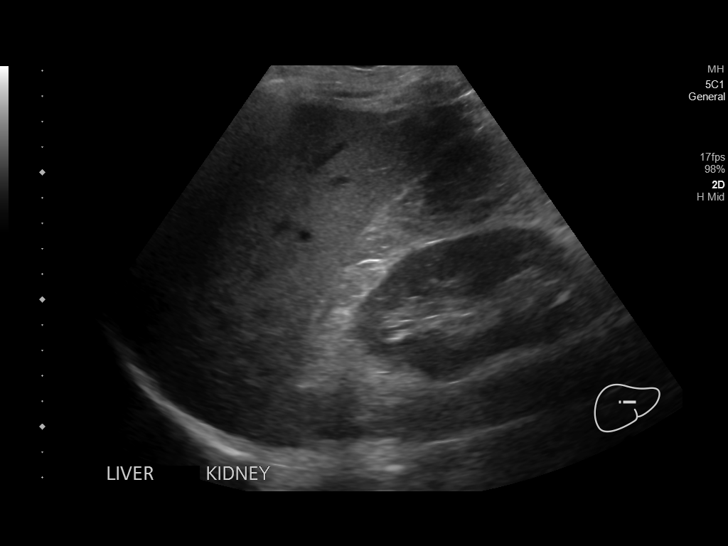
[im 38/114]
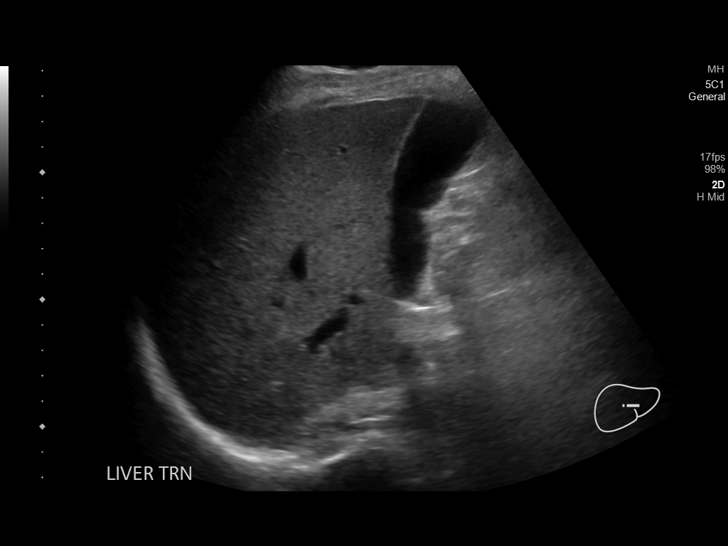
[im 43/114]
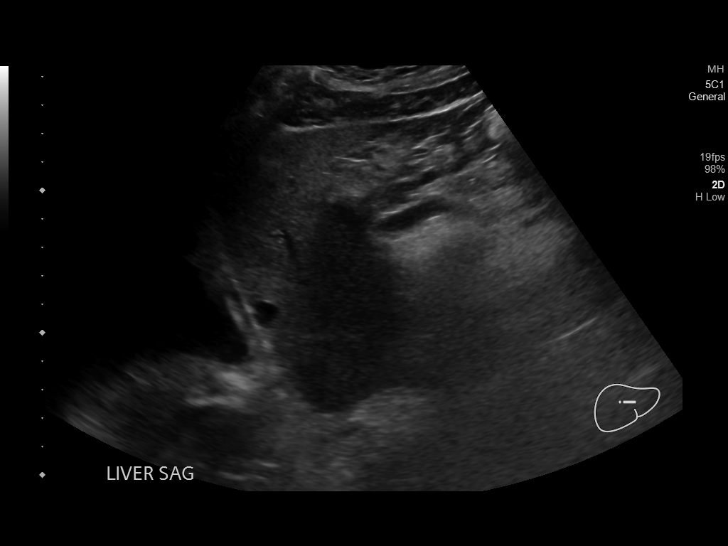
[im 52/114]
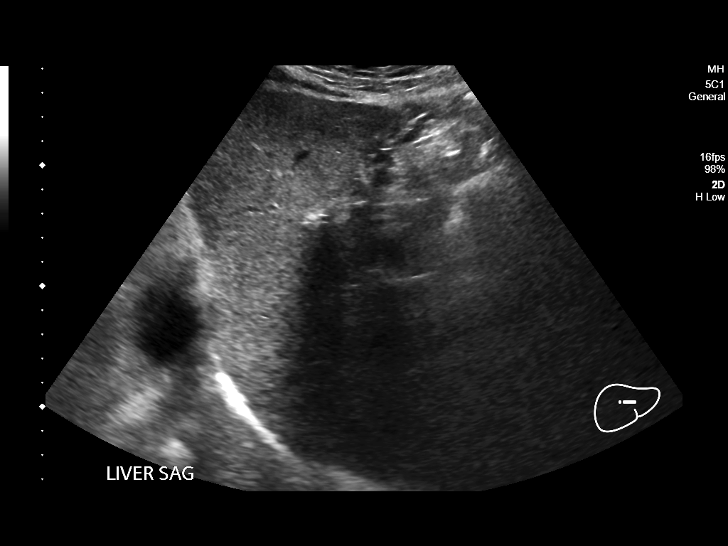
[im 62/114]
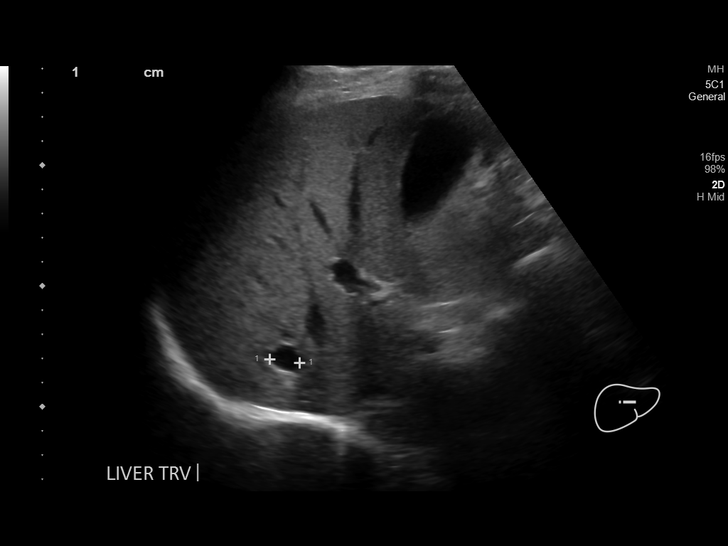
[im 71/114]
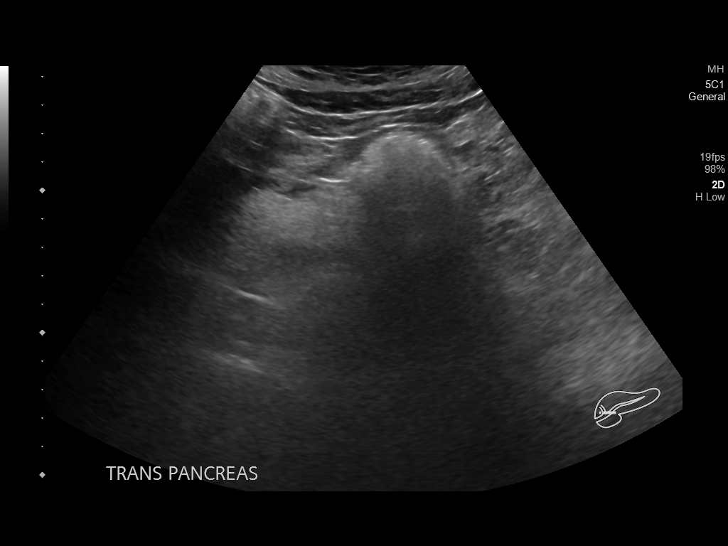
[im 76/114]
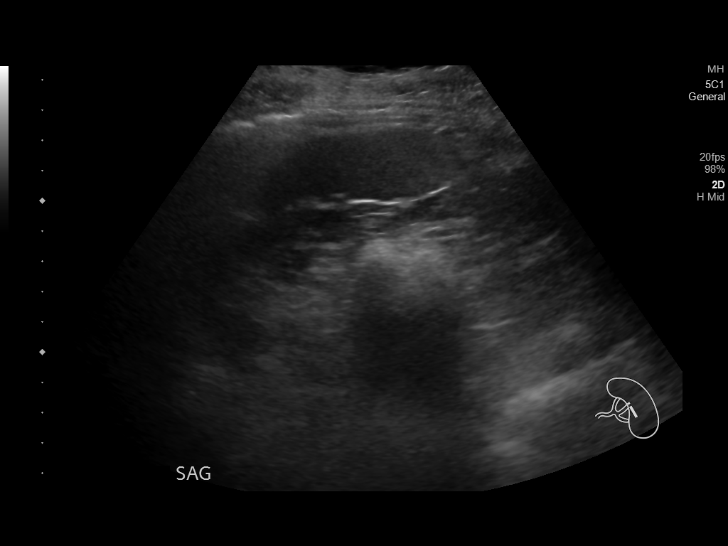
[im 85/114]
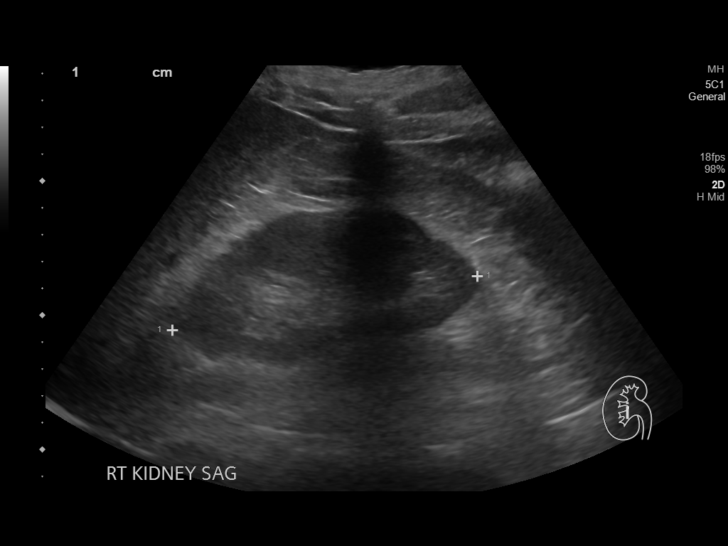
[im 95/114]
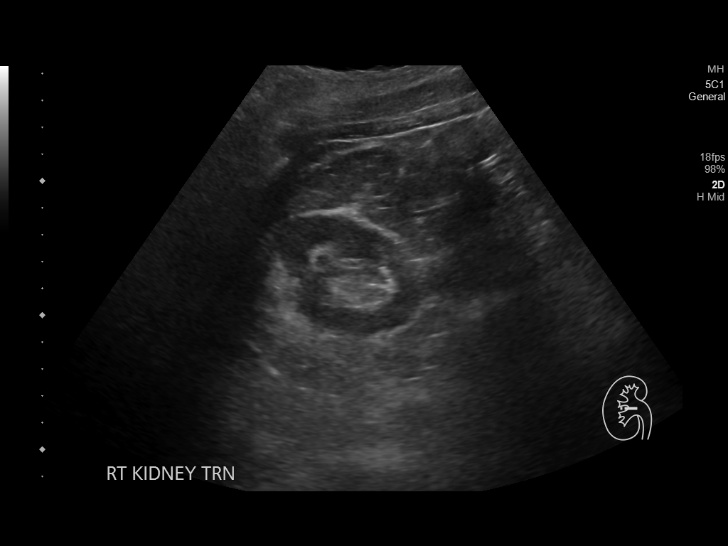
[im 104/114]
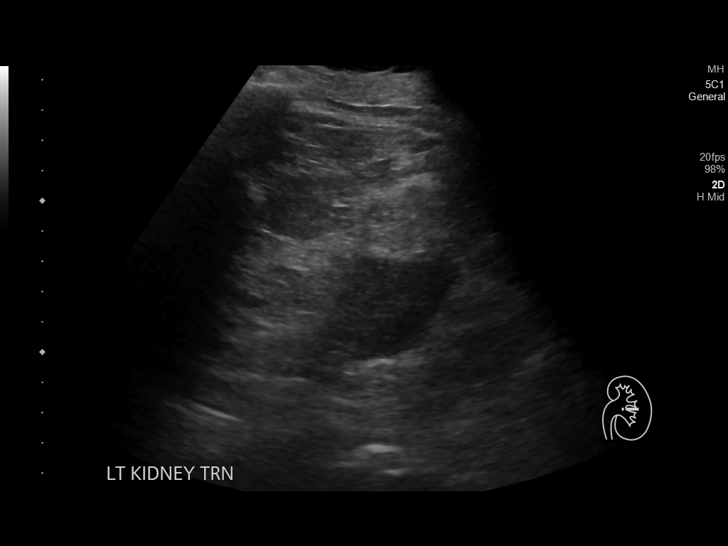
[im 114/114]
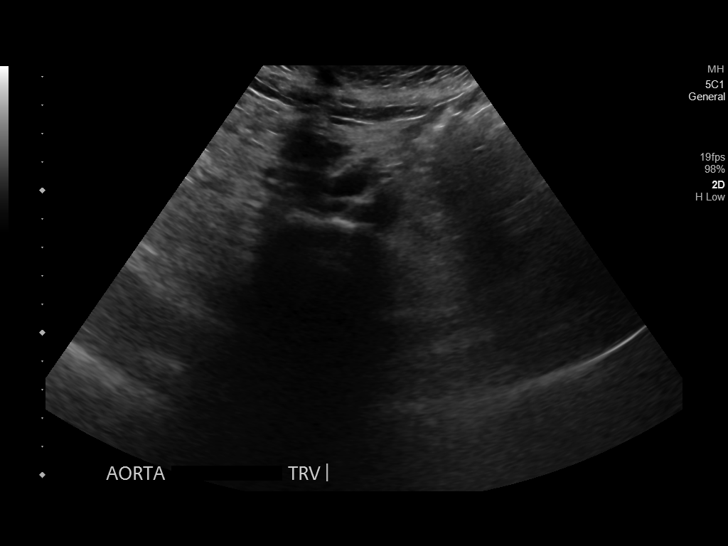

[14 of 25 positions shown; findings below may reference images not displayed]

FINDINGS: Gallbladder: No gallstones or wall thickening visualized. No
sonographic Murphy sign noted by sonographer.

Common bile duct: Diameter: 4.8 mm

Liver: Simple liver cyst measuring up to 3.1 cm in the right lobe
peripherally. Mildly increased liver echogenicity. Portal vein is
patent on color Doppler imaging with normal direction of blood flow
towards the liver.

IVC: No abnormality visualized.

Pancreas: Largely obscured by bowel gas, visualized portions are
normal.

Spleen: Size and appearance within normal limits.

Right Kidney: Length: 11.5 cm. Echogenicity within normal limits. No
mass or hydronephrosis visualized.

Left Kidney: Length: 12.7 cm. Echogenicity within normal limits. No
mass or hydronephrosis visualized.

Abdominal aorta: No aneurysm visualized.

Other findings: None.
IMPRESSION: Simple liver cysts and hepatic steatosis. No acute process
identified.

By: Lianne Hsiao M.D.

## 2019-11-07 ENCOUNTER — Encounter: Payer: Self-pay | Admitting: Oncology

## 2019-11-08 ENCOUNTER — Other Ambulatory Visit: Payer: Self-pay | Admitting: *Deleted

## 2019-11-08 DIAGNOSIS — Z833 Family history of diabetes mellitus: Secondary | ICD-10-CM

## 2019-11-08 DIAGNOSIS — D696 Thrombocytopenia, unspecified: Secondary | ICD-10-CM

## 2019-11-16 ENCOUNTER — Inpatient Hospital Stay: Payer: BC Managed Care – PPO | Admitting: Oncology

## 2019-11-16 ENCOUNTER — Other Ambulatory Visit: Payer: Self-pay

## 2019-11-16 ENCOUNTER — Inpatient Hospital Stay: Payer: BC Managed Care – PPO | Attending: Oncology

## 2019-11-16 ENCOUNTER — Telehealth: Payer: Self-pay | Admitting: *Deleted

## 2019-11-16 VITALS — BP 121/74 | HR 71 | Temp 98.2°F | Resp 17 | Ht 71.0 in | Wt 203.7 lb

## 2019-11-16 DIAGNOSIS — Z8639 Personal history of other endocrine, nutritional and metabolic disease: Secondary | ICD-10-CM | POA: Insufficient documentation

## 2019-11-16 DIAGNOSIS — D696 Thrombocytopenia, unspecified: Secondary | ICD-10-CM | POA: Diagnosis not present

## 2019-11-16 DIAGNOSIS — R5381 Other malaise: Secondary | ICD-10-CM | POA: Diagnosis not present

## 2019-11-16 DIAGNOSIS — Z833 Family history of diabetes mellitus: Secondary | ICD-10-CM

## 2019-11-16 DIAGNOSIS — D693 Immune thrombocytopenic purpura: Secondary | ICD-10-CM | POA: Insufficient documentation

## 2019-11-16 LAB — HEMOGLOBIN A1C
Hgb A1c MFr Bld: 5.3 % (ref 4.8–5.6)
Mean Plasma Glucose: 105.41 mg/dL

## 2019-11-16 LAB — CBC WITH DIFFERENTIAL (CANCER CENTER ONLY)
Abs Immature Granulocytes: 0.01 10*3/uL (ref 0.00–0.07)
Basophils Absolute: 0 10*3/uL (ref 0.0–0.1)
Basophils Relative: 1 %
Eosinophils Absolute: 0.2 10*3/uL (ref 0.0–0.5)
Eosinophils Relative: 3 %
HCT: 46 % (ref 39.0–52.0)
Hemoglobin: 16.2 g/dL (ref 13.0–17.0)
Immature Granulocytes: 0 %
Lymphocytes Relative: 25 %
Lymphs Abs: 1.5 10*3/uL (ref 0.7–4.0)
MCH: 31.2 pg (ref 26.0–34.0)
MCHC: 35.2 g/dL (ref 30.0–36.0)
MCV: 88.5 fL (ref 80.0–100.0)
Monocytes Absolute: 0.6 10*3/uL (ref 0.1–1.0)
Monocytes Relative: 10 %
Neutro Abs: 3.6 10*3/uL (ref 1.7–7.7)
Neutrophils Relative %: 61 %
Platelet Count: 180 10*3/uL (ref 150–400)
RBC: 5.2 MIL/uL (ref 4.22–5.81)
RDW: 12.3 % (ref 11.5–15.5)
WBC Count: 5.8 10*3/uL (ref 4.0–10.5)
nRBC: 0 % (ref 0.0–0.2)

## 2019-11-16 LAB — CMP (CANCER CENTER ONLY)
ALT: 27 U/L (ref 0–44)
AST: 22 U/L (ref 15–41)
Albumin: 4.2 g/dL (ref 3.5–5.0)
Alkaline Phosphatase: 73 U/L (ref 38–126)
Anion gap: 10 (ref 5–15)
BUN: 19 mg/dL (ref 8–23)
CO2: 26 mmol/L (ref 22–32)
Calcium: 9.1 mg/dL (ref 8.9–10.3)
Chloride: 108 mmol/L (ref 98–111)
Creatinine: 1.13 mg/dL (ref 0.61–1.24)
GFR, Est AFR Am: >60 mL/min (ref >60)
GFR, Estimated: >60 mL/min (ref >60)
Glucose, Bld: 95 mg/dL (ref 70–99)
Potassium: 4.4 mmol/L (ref 3.5–5.1)
Sodium: 144 mmol/L (ref 135–145)
Total Bilirubin: 0.4 mg/dL (ref 0.3–1.2)
Total Protein: 7.1 g/dL (ref 6.5–8.1)

## 2019-11-16 NOTE — Telephone Encounter (Signed)
-----   Message from Ladell Pier, MD sent at 11/16/2019  2:19 PM EDT ----- Please call patient, hemoglobin A1c is normal

## 2019-11-16 NOTE — Progress Notes (Signed)
  [place] OFFICE PROGRESS NOTE   Diagnosis: ITP  INTERVAL HISTORY:   Andre Vega returns as scheduled.  He feels well.  Good appetite and energy level.  He walks approximately 5 miles daily.  No bleeding aside from chronic mild "hemorrhoid "bleeding.  He is scheduled for a colonoscopy within the next few months. He has discomfort at the low posterior bilateral neck.  He has received both doses of the COVID-19 vaccine.  Is concerned he may have axillary lymphadenopathy.  Objective:  Vital signs in last 24 hours:  Blood pressure 121/74, pulse 71, temperature 98.2 F (36.8 C), temperature source Temporal, resp. rate 17, height 5' 11"  (1.803 m), weight 203 lb 11.2 oz (92.4 kg), SpO2 100 %.    HEENT: Neck without mass Lymphatics: No cervical, supraclavicular, axillary, or inguinal nodes GI: No hepatosplenomegaly, no mass, nontender Vascular: No leg edema Musculoskeletal: No tenderness at the neck, no mass at the posterior neck or trapezius region   Lab Results:  Lab Results  Component Value Date   WBC 5.8 11/16/2019   HGB 16.2 11/16/2019   HCT 46.0 11/16/2019   MCV 88.5 11/16/2019   PLT 180 11/16/2019   NEUTROABS 3.6 11/16/2019    CMP  Lab Results  Component Value Date   NA 144 11/16/2019   K 4.4 11/16/2019   CL 108 11/16/2019   CO2 26 11/16/2019   GLUCOSE 95 11/16/2019   BUN 19 11/16/2019   CREATININE 1.13 11/16/2019   CALCIUM 9.1 11/16/2019   PROT 7.1 11/16/2019   ALBUMIN 4.2 11/16/2019   AST 22 11/16/2019   ALT 27 11/16/2019   ALKPHOS 73 11/16/2019   BILITOT 0.4 11/16/2019   GFRNONAA >60 11/16/2019   GFRAA >60 11/16/2019     Medications: I have reviewed the patient's current medications.   Assessment/Plan: 1.  Thrombocytopenia  02/04/2018 platelet count 19,000  Solu-Medrol 80 mg IV 02/04/2018  Prednisone 60 mg daily beginning 02/05/2018  02/08/2018 platelet count 45,000  Prednisone 40 mg daily beginning 02/11/2018  02/15/2018  platelet count 92,000  02/22/2018 platelet count 200,000  Prednisone 20 mg daily beginning 02/23/2018  Prednisone 15 mg daily beginning 03/09/2018  Prednisone  10 mg daily beginning 03/22/2018  Prednisone 5 mg daily beginning 05/03/2018  Prednisone 2.5 mg daily beginning 05/24/2018  Prednisone 2.5 mg every other day, taper to off 07/27/2018 2. History of moderate to heavy alcohol use 3. Malaise 4. 02/04/2018 mono screen negative; EBV IgG positive, EBV IgM negative      Disposition: Andre Vega remains in clinical remission from ITP.  He will return for an office visit and CBC in October.  The neck discomfort is likely related to a benign musculoskeletal condition.  He will call in the interim for new symptoms.  [name], MD  11/16/2019  2:20 PM

## 2019-11-16 NOTE — Telephone Encounter (Signed)
Notified patient of normal A1c and labs and office note faxed to his PCP as well.

## 2020-01-13 IMAGING — US US LIVER ELASTOGRAPHY
1 series · 12 of 12 positions shown · non-contrast
Comparison: Abdomen ultrasound on 02/04/2018

CLINICAL DATA: Nonalcoholic steatohepatitis.

EXAM:
US LIVER ELASTOGRAPHY
TECHNIQUE: Ultrasound elastography evaluation of the liver was performed. A
region of interest was placed in the right lobe of the liver.
Following application of a compressive sonographic pulse, shear
waves were detected in the adjacent hepatic tissue and the shear
wave velocity was calculated. Multiple assessments were performed at
the selected site. Median shear wave velocity is correlated to a
Metavir fibrosis score.

[Series 1: us liver elastography · 0.14mm/px · 12 of 12 slices shown]
[im 1/12]
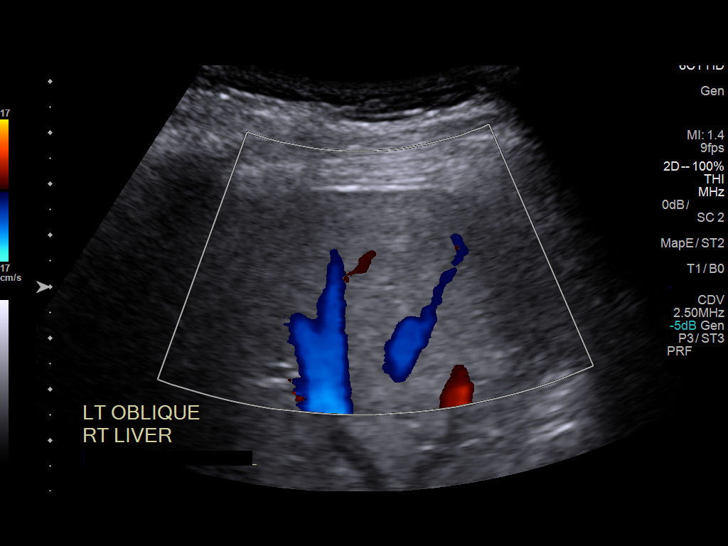
[im 2/12]
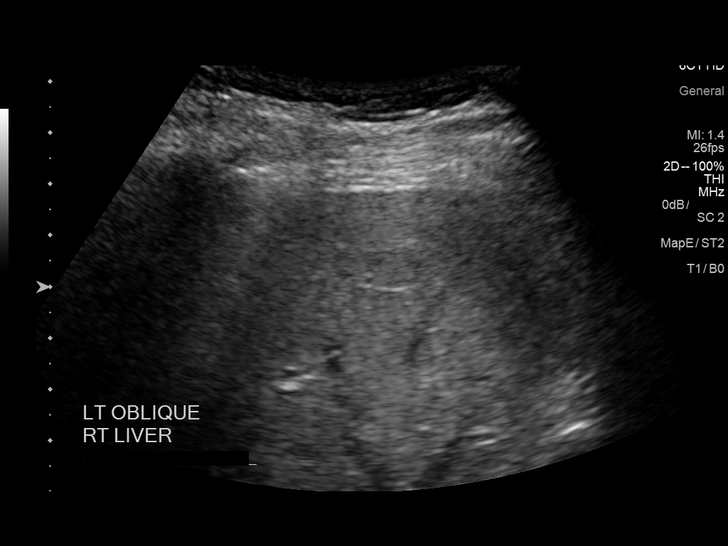
[im 3/12]
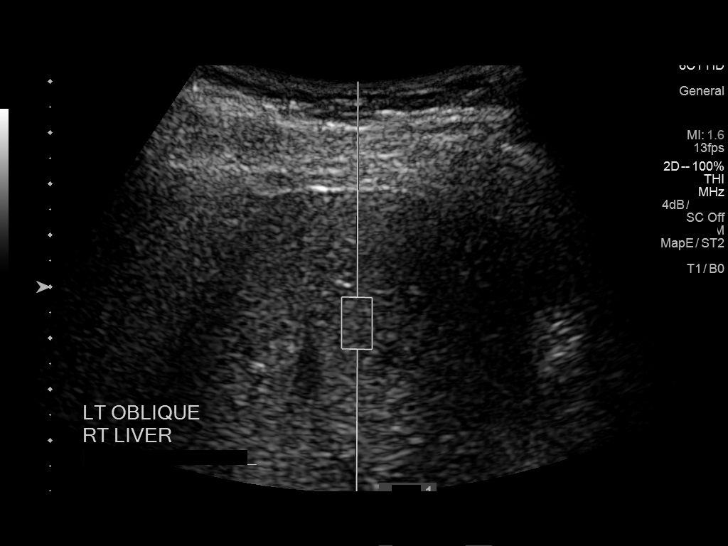
[im 4/12]
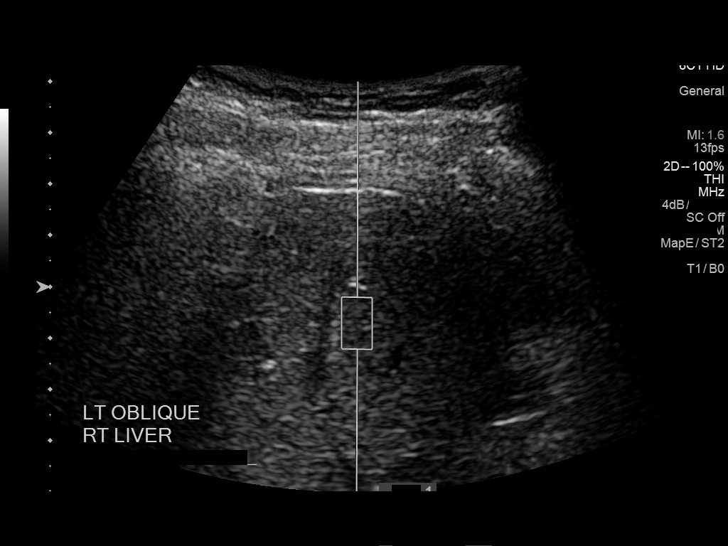
[im 5/12]
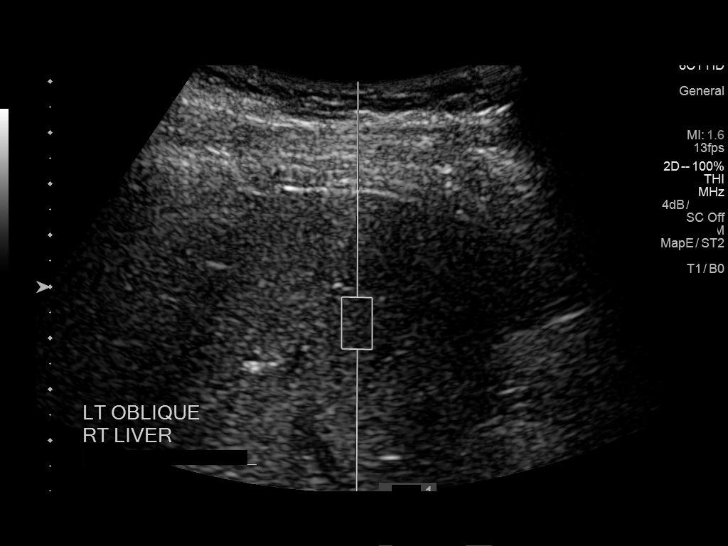
[im 6/12]
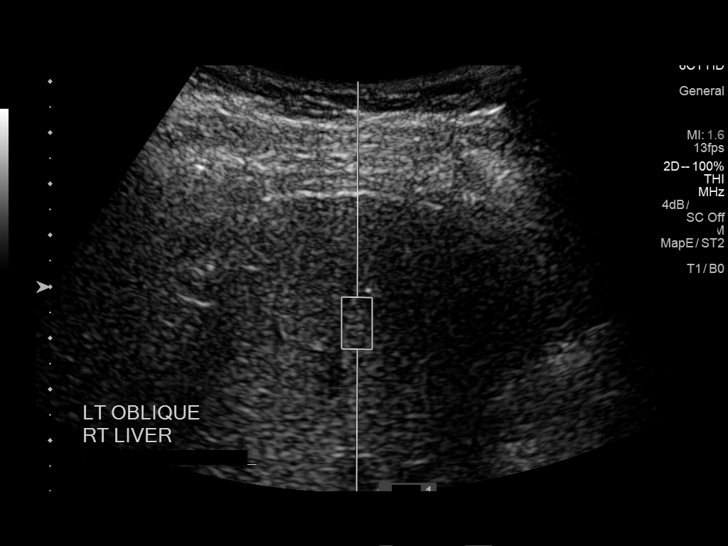
[im 7/12]
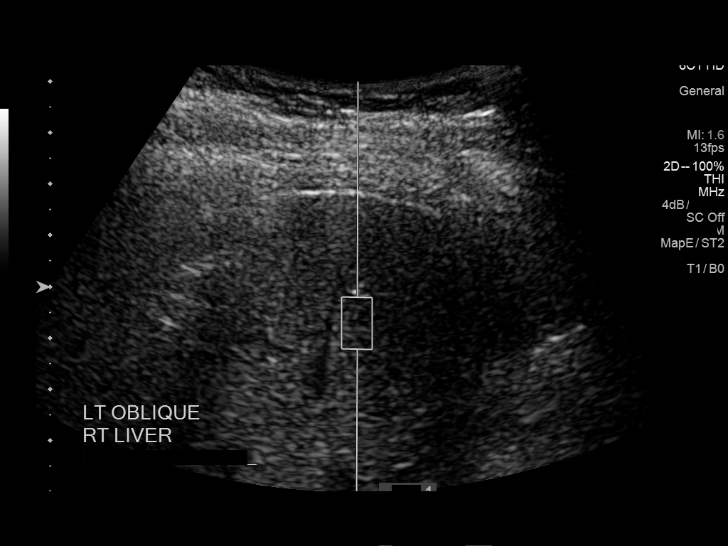
[im 8/12]
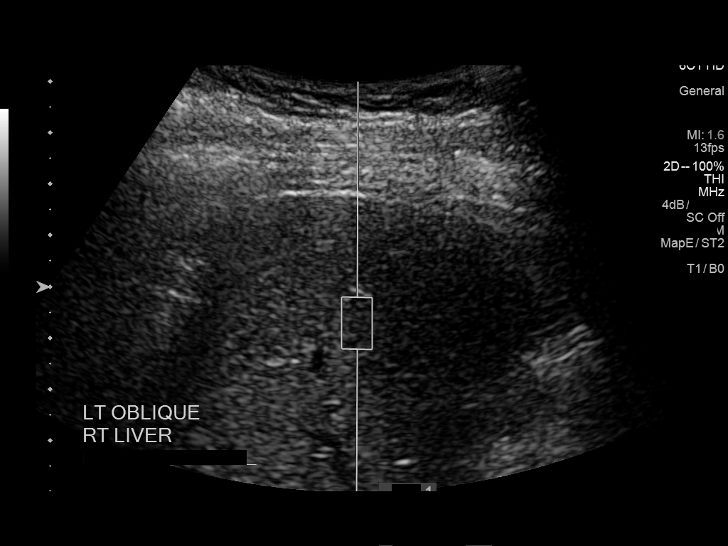
[im 9/12]
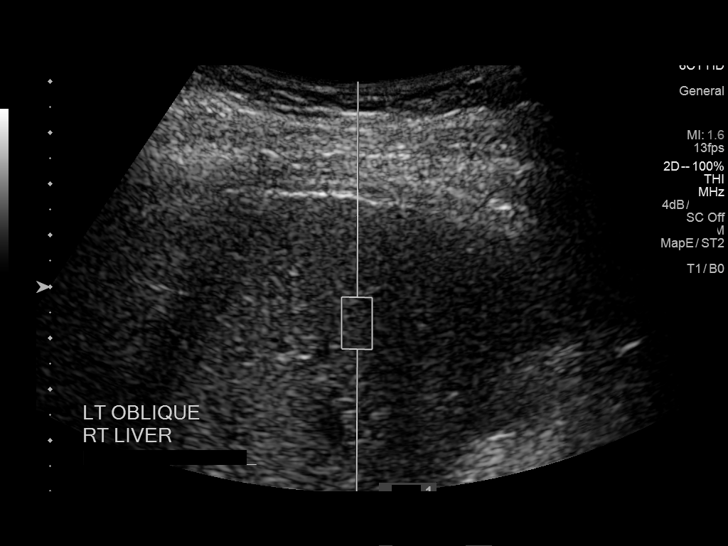
[im 10/12]
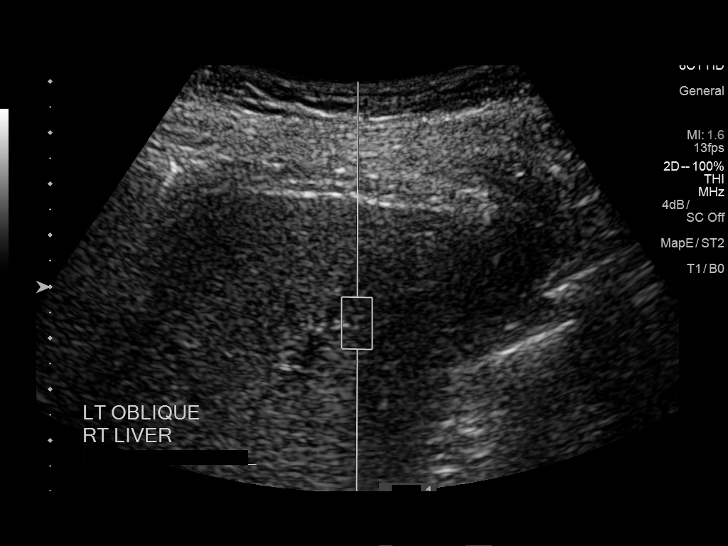
[im 11/12]
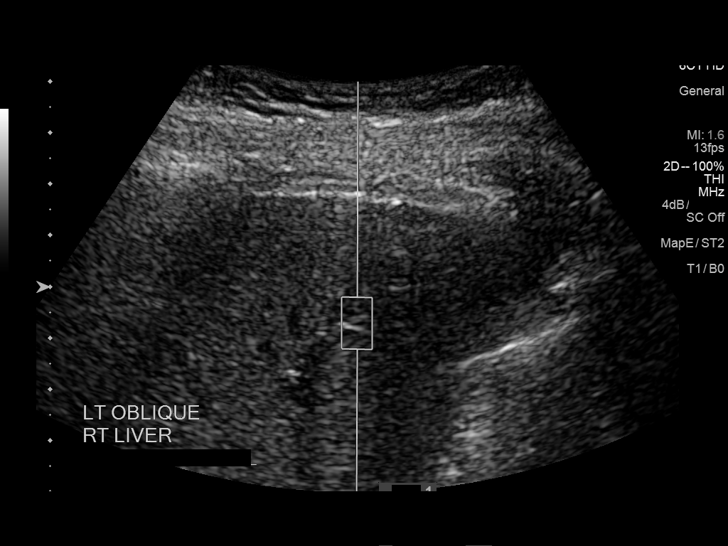
[im 12/12]
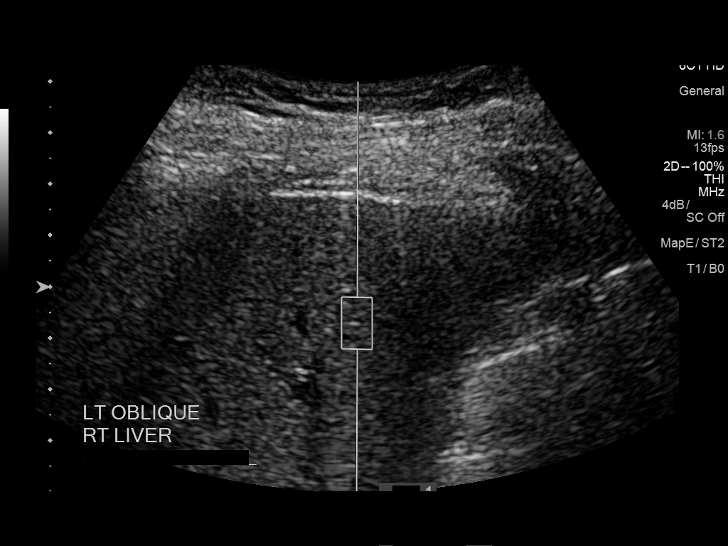

[12 of 12 positions shown; findings below may reference images not displayed]

FINDINGS: Liver: Mildly increased echogenicity of the hepatic parenchyma,
consistent with hepatic steatosis. Several small hepatic cysts again
noted, largest measuring 3.2 cm. No liver masses identified. Portal
vein is patent on color Doppler imaging with normal direction of
blood flow towards the liver.

ULTRASOUND HEPATIC ELASTOGRAPHY

Device: Siemens Helix VTQ

Patient position: Oblique

Transducer: [email or web address]

Number of measurements: 10

Hepatic segment:  8

Median velocity:   0.85 m/sec

IQR:

IQR/Median velocity ratio:

Corresponding Metavir fibrosis score:  F0/F1

Risk of fibrosis: Minimal

Limitations of exam: None

Please note that abnormal shear wave velocities may also be
identified in clinical settings other than with hepatic fibrosis,
such as: acute hepatitis, elevated right heart and central venous
pressures including use of beta blockers, Jhovany disease
(Lw), infiltrative processes such as
mastocytosis/amyloidosis/infiltrative tumor, extrahepatic
cholestasis, in the post-prandial state, and liver transplantation.
Correlation with patient history, laboratory data, and clinical
condition recommended.
IMPRESSION: Liver: Hepatic steatosis and several small hepatic cysts. No
evidence of hepatic mass.

Elastography: Median hepatic shear wave velocity is calculated at
0.85 m/sec.

Corresponding Metavir fibrosis score is F0/F1.

Risk of fibrosis is Minimal.

Follow-up: None required

## 2020-05-07 ENCOUNTER — Other Ambulatory Visit: Payer: Self-pay

## 2020-05-07 ENCOUNTER — Telehealth: Payer: Self-pay | Admitting: Oncology

## 2020-05-07 ENCOUNTER — Inpatient Hospital Stay (HOSPITAL_BASED_OUTPATIENT_CLINIC_OR_DEPARTMENT_OTHER): Payer: Medicare PPO | Admitting: Oncology

## 2020-05-07 ENCOUNTER — Inpatient Hospital Stay: Payer: Medicare PPO | Attending: Oncology

## 2020-05-07 VITALS — BP 109/78 | HR 77 | Temp 97.7°F | Resp 20 | Ht 73.0 in | Wt 201.7 lb

## 2020-05-07 DIAGNOSIS — R5381 Other malaise: Secondary | ICD-10-CM | POA: Insufficient documentation

## 2020-05-07 DIAGNOSIS — D693 Immune thrombocytopenic purpura: Secondary | ICD-10-CM | POA: Diagnosis present

## 2020-05-07 DIAGNOSIS — D696 Thrombocytopenia, unspecified: Secondary | ICD-10-CM | POA: Diagnosis not present

## 2020-05-07 DIAGNOSIS — M1990 Unspecified osteoarthritis, unspecified site: Secondary | ICD-10-CM | POA: Diagnosis not present

## 2020-05-07 LAB — CBC WITH DIFFERENTIAL (CANCER CENTER ONLY)
Abs Immature Granulocytes: 0.01 10*3/uL (ref 0.00–0.07)
Basophils Absolute: 0 10*3/uL (ref 0.0–0.1)
Basophils Relative: 1 %
Eosinophils Absolute: 0.3 10*3/uL (ref 0.0–0.5)
Eosinophils Relative: 5 %
HCT: 46 % (ref 39.0–52.0)
Hemoglobin: 15.8 g/dL (ref 13.0–17.0)
Immature Granulocytes: 0 %
Lymphocytes Relative: 23 %
Lymphs Abs: 1.3 10*3/uL (ref 0.7–4.0)
MCH: 31.2 pg (ref 26.0–34.0)
MCHC: 34.3 g/dL (ref 30.0–36.0)
MCV: 90.7 fL (ref 80.0–100.0)
Monocytes Absolute: 0.5 10*3/uL (ref 0.1–1.0)
Monocytes Relative: 9 %
Neutro Abs: 3.6 10*3/uL (ref 1.7–7.7)
Neutrophils Relative %: 62 %
Platelet Count: 196 10*3/uL (ref 150–400)
RBC: 5.07 MIL/uL (ref 4.22–5.81)
RDW: 11.9 % (ref 11.5–15.5)
WBC Count: 5.7 10*3/uL (ref 4.0–10.5)
nRBC: 0 % (ref 0.0–0.2)

## 2020-05-07 NOTE — Telephone Encounter (Signed)
Scheduled per los. Gave avs and calendar  

## 2020-05-07 NOTE — Progress Notes (Signed)
  [place] OFFICE PROGRESS NOTE   Diagnosis: ITP  INTERVAL HISTORY:   Andre Vega returns as scheduled.  He feels well.  He is exercising.  He has chronic arthritis pain.  No other complaint.  Objective:  Vital signs in last 24 hours:  Blood pressure 109/78, pulse 77, temperature 97.7 F (36.5 C), temperature source Tympanic, resp. rate 20, height 6' 1"  (1.854 m), weight 201 lb 11.2 oz (91.5 kg), SpO2 95 %.    Lymphatics: No cervical, supraclavicular, axillary, or inguinal nodes Resp: Lungs clear bilaterally  Cardio: Regular rate and rhythm GI: No hepatosplenomegaly, no mass, nontender Vascular: No leg edema    Lab Results:  Lab Results  Component Value Date   WBC 5.7 05/07/2020   HGB 15.8 05/07/2020   HCT 46.0 05/07/2020   MCV 90.7 05/07/2020   PLT 196 05/07/2020   NEUTROABS 3.6 05/07/2020    CMP  Lab Results  Component Value Date   NA 144 11/16/2019   K 4.4 11/16/2019   CL 108 11/16/2019   CO2 26 11/16/2019   GLUCOSE 95 11/16/2019   BUN 19 11/16/2019   CREATININE 1.13 11/16/2019   CALCIUM 9.1 11/16/2019   PROT 7.1 11/16/2019   ALBUMIN 4.2 11/16/2019   AST 22 11/16/2019   ALT 27 11/16/2019   ALKPHOS 73 11/16/2019   BILITOT 0.4 11/16/2019   GFRNONAA >60 11/16/2019   GFRAA >60 11/16/2019    Medications: I have reviewed the patient's current medications.   Assessment/Plan: 1.  Thrombocytopenia  02/04/2018 platelet count 19,000  Solu-Medrol 80 mg IV 02/04/2018  Prednisone 60 mg daily beginning 02/05/2018  02/08/2018 platelet count 45,000  Prednisone 40 mg daily beginning 02/11/2018  02/15/2018 platelet count 92,000  02/22/2018 platelet count 200,000  Prednisone 20 mg daily beginning 02/23/2018  Prednisone 15 mg daily beginning 03/09/2018  Prednisone  10 mg daily beginning 03/22/2018  Prednisone 5 mg daily beginning 05/03/2018  Prednisone 2.5 mg daily beginning 05/24/2018  Prednisone 2.5 mg every other day, taper to off  07/27/2018 2. History of moderate to heavy alcohol use 3. Malaise 4. 02/04/2018 mono screen negative; EBV IgG positive, EBV IgM negative   Disposition: The platelet count remains in the normal range.  Andre Vega will return for an office and lab visit in 6 months.  [name], MD  05/07/2020  12:49 PM

## 2020-05-10 ENCOUNTER — Other Ambulatory Visit: Payer: BC Managed Care – PPO

## 2020-05-10 ENCOUNTER — Ambulatory Visit: Payer: Self-pay | Admitting: Oncology

## 2020-05-10 ENCOUNTER — Ambulatory Visit: Payer: BC Managed Care – PPO | Admitting: Oncology

## 2020-11-01 ENCOUNTER — Other Ambulatory Visit: Payer: Self-pay

## 2020-11-01 ENCOUNTER — Inpatient Hospital Stay: Payer: Medicare PPO

## 2020-11-01 ENCOUNTER — Telehealth: Payer: Self-pay | Admitting: Oncology

## 2020-11-01 ENCOUNTER — Other Ambulatory Visit: Payer: Medicare PPO

## 2020-11-01 ENCOUNTER — Inpatient Hospital Stay: Payer: Medicare PPO | Attending: Oncology | Admitting: Oncology

## 2020-11-01 VITALS — BP 112/72 | HR 76 | Temp 98.1°F | Resp 18 | Ht 73.0 in | Wt 200.0 lb

## 2020-11-01 DIAGNOSIS — D693 Immune thrombocytopenic purpura: Secondary | ICD-10-CM | POA: Insufficient documentation

## 2020-11-01 DIAGNOSIS — R5381 Other malaise: Secondary | ICD-10-CM | POA: Diagnosis not present

## 2020-11-01 DIAGNOSIS — F1021 Alcohol dependence, in remission: Secondary | ICD-10-CM | POA: Insufficient documentation

## 2020-11-01 DIAGNOSIS — M25561 Pain in right knee: Secondary | ICD-10-CM | POA: Diagnosis not present

## 2020-11-01 DIAGNOSIS — M542 Cervicalgia: Secondary | ICD-10-CM | POA: Insufficient documentation

## 2020-11-01 DIAGNOSIS — D696 Thrombocytopenia, unspecified: Secondary | ICD-10-CM

## 2020-11-01 LAB — HEMOGLOBIN A1C
Hgb A1c MFr Bld: 5.5 % (ref 4.8–5.6)
Mean Plasma Glucose: 111.15 mg/dL

## 2020-11-01 LAB — CMP (CANCER CENTER ONLY)
ALT: 23 U/L (ref 0–44)
AST: 22 U/L (ref 15–41)
Albumin: 4.4 g/dL (ref 3.5–5.0)
Alkaline Phosphatase: 58 U/L (ref 38–126)
Anion gap: 8 (ref 5–15)
BUN: 16 mg/dL (ref 8–23)
CO2: 28 mmol/L (ref 22–32)
Calcium: 9.1 mg/dL (ref 8.9–10.3)
Chloride: 106 mmol/L (ref 98–111)
Creatinine: 1.1 mg/dL (ref 0.61–1.24)
GFR, Estimated: >60 mL/min (ref >60)
Glucose, Bld: 88 mg/dL (ref 70–99)
Potassium: 4 mmol/L (ref 3.5–5.1)
Sodium: 142 mmol/L (ref 135–145)
Total Bilirubin: 0.4 mg/dL (ref 0.3–1.2)
Total Protein: 6.9 g/dL (ref 6.5–8.1)

## 2020-11-01 LAB — CBC WITH DIFFERENTIAL (CANCER CENTER ONLY)
Abs Immature Granulocytes: 0.01 10*3/uL (ref 0.00–0.07)
Basophils Absolute: 0.1 10*3/uL (ref 0.0–0.1)
Basophils Relative: 1 %
Eosinophils Absolute: 0.2 10*3/uL (ref 0.0–0.5)
Eosinophils Relative: 3 %
HCT: 45 % (ref 39.0–52.0)
Hemoglobin: 15.5 g/dL (ref 13.0–17.0)
Immature Granulocytes: 0 %
Lymphocytes Relative: 26 %
Lymphs Abs: 1.7 10*3/uL (ref 0.7–4.0)
MCH: 31.1 pg (ref 26.0–34.0)
MCHC: 34.4 g/dL (ref 30.0–36.0)
MCV: 90.4 fL (ref 80.0–100.0)
Monocytes Absolute: 0.6 10*3/uL (ref 0.1–1.0)
Monocytes Relative: 10 %
Neutro Abs: 4 10*3/uL (ref 1.7–7.7)
Neutrophils Relative %: 60 %
Platelet Count: 220 10*3/uL (ref 150–400)
RBC: 4.98 MIL/uL (ref 4.22–5.81)
RDW: 12.1 % (ref 11.5–15.5)
WBC Count: 6.5 10*3/uL (ref 4.0–10.5)
nRBC: 0 % (ref 0.0–0.2)

## 2020-11-01 NOTE — Telephone Encounter (Signed)
Scheduled appt per 4/7 los - gave patient AVS and calender.

## 2020-11-01 NOTE — Progress Notes (Signed)
  [place] OFFICE PROGRESS NOTE   Diagnosis: ITP  INTERVAL HISTORY:   Andre Vega returns as scheduled.  He complains of discomfort at the left neck and right knee.  He is participating in a physical therapy program.  He reports intentional weight loss.  He no longer consumes alcohol.  He exercises.  Objective:  Vital signs in last 24 hours:  Blood pressure 112/72, pulse 76, temperature 98.1 F (36.7 C), temperature source Tympanic, resp. rate 18, height 6' 1"  (1.854 m), weight 200 lb (90.7 kg), SpO2 100 %.    HEENT: Neck without mass Lymphatics: No cervical, supraclavicular, axillary, or inguinal nodes Resp: Lungs clear bilaterally Cardio: Regular rate and rhythm GI: No hepatosplenomegaly, no mass, nontender Vascular: No leg edema Musculoskeletal: Slight hypertrophy of the right knee joint compared to the left side, no effusion.  The area of neck discomfort localizes to the left trapezius region.  No mass.   Lab Results:  Lab Results  Component Value Date   WBC 6.5 11/01/2020   HGB 15.5 11/01/2020   HCT 45.0 11/01/2020   MCV 90.4 11/01/2020   PLT 220 11/01/2020   NEUTROABS 4.0 11/01/2020    CMP  Lab Results  Component Value Date   NA 144 11/16/2019   K 4.4 11/16/2019   CL 108 11/16/2019   CO2 26 11/16/2019   GLUCOSE 95 11/16/2019   BUN 19 11/16/2019   CREATININE 1.13 11/16/2019   CALCIUM 9.1 11/16/2019   PROT 7.1 11/16/2019   ALBUMIN 4.2 11/16/2019   AST 22 11/16/2019   ALT 27 11/16/2019   ALKPHOS 73 11/16/2019   BILITOT 0.4 11/16/2019   GFRNONAA >60 11/16/2019   GFRAA >60 11/16/2019     Medications: I have reviewed the patient's current medications.   Assessment/Plan:  1. Thrombocytopenia  02/04/2018 platelet count 19,000  Solu-Medrol 80 mg IV 02/04/2018  Prednisone 60 mg daily beginning 02/05/2018  02/08/2018 platelet count 45,000  Prednisone 40 mg daily beginning 02/11/2018  02/15/2018 platelet count 92,000  02/22/2018  platelet count 200,000  Prednisone 20 mg daily beginning 02/23/2018  Prednisone 15 mg daily beginning 03/09/2018  Prednisone  10 mg daily beginning 03/22/2018  Prednisone 5 mg daily beginning 05/03/2018  Prednisone 2.5 mg daily beginning 05/24/2018  Prednisone 2.5 mg every other day, taper to off 07/27/2018 2. History of moderate to heavy alcohol use 3. Malaise 4. 02/04/2018 mono screen negative; EBV IgG positive, EBV IgM negative    Disposition: [name] remains in clinical remission from ITP.  The platelet count is in the normal range.  He will return for an office visit and CBC in 6 months.  He requested we check the hemoglobin A1c today.  I suspect the neck and knee discomfort are related to benign muskuloskeletal changes.  [name], MD  11/01/2020  5:16 PM

## 2020-12-05 IMAGING — US US ABDOMEN LIMITED
1 series · 14 of 25 positions shown · non-contrast
Comparison: May 05, 2018.

CLINICAL DATA: Fatty liver.

EXAM:
ULTRASOUND ABDOMEN LIMITED RIGHT UPPER QUADRANT

[Series 1: us abdomen limited · 0.25mm/px · 14 of 48 slices shown]
[im 1/48]
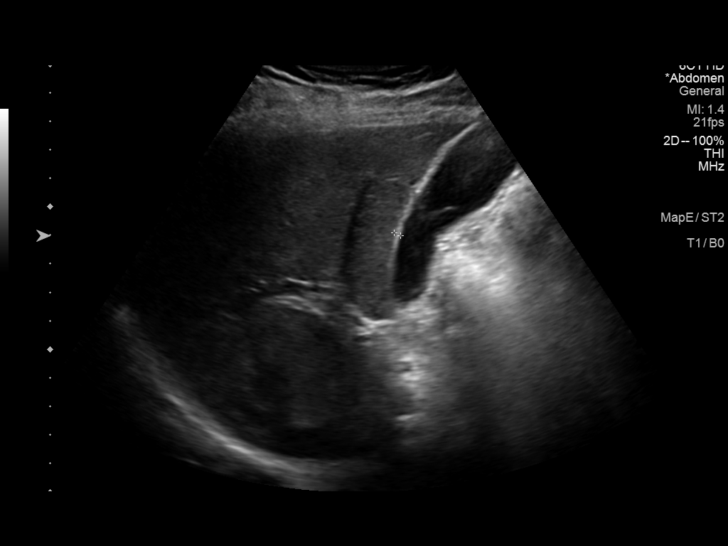
[im 4/48]
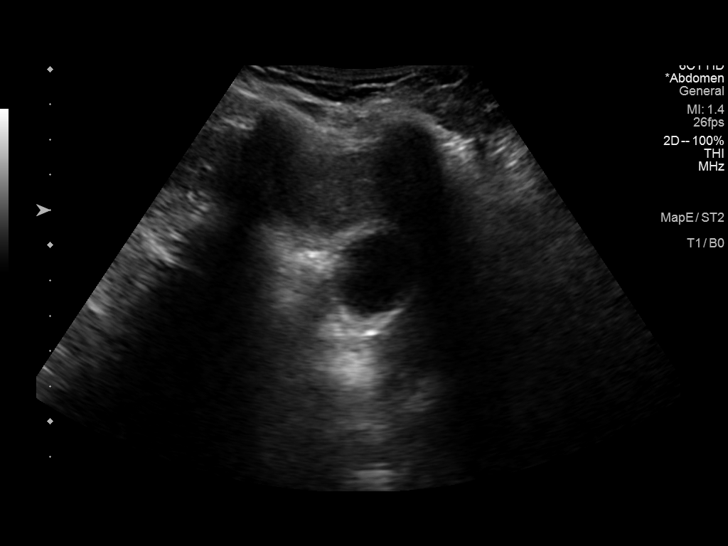
[im 8/48]
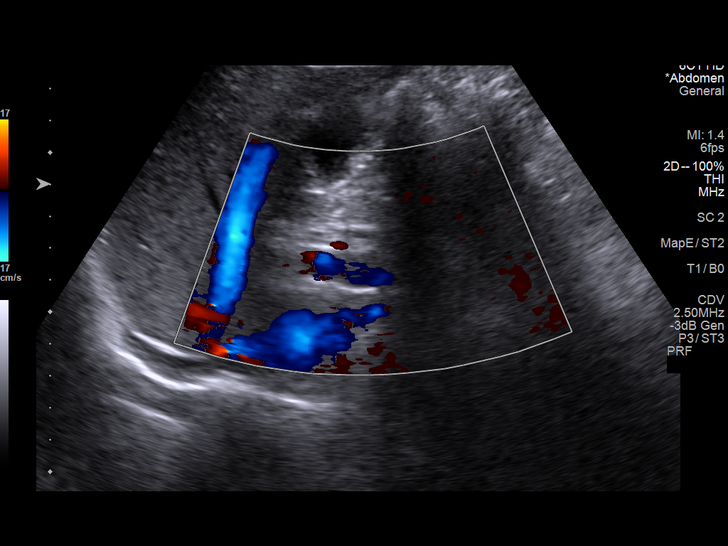
[im 12/48]
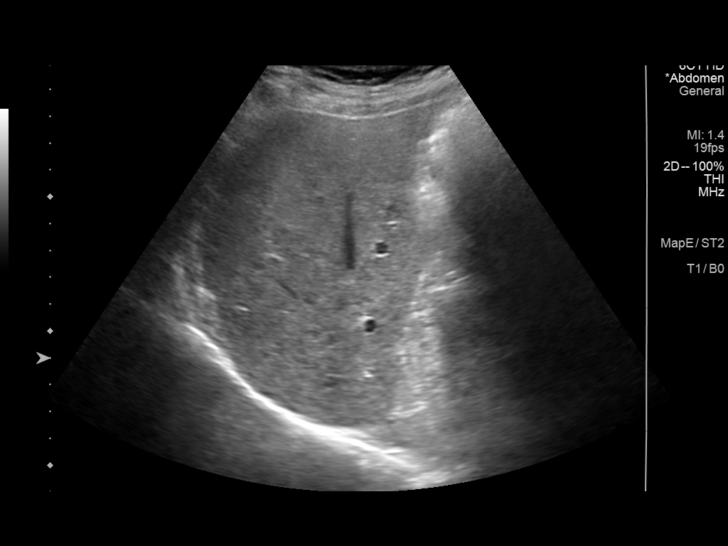
[im 16/48]
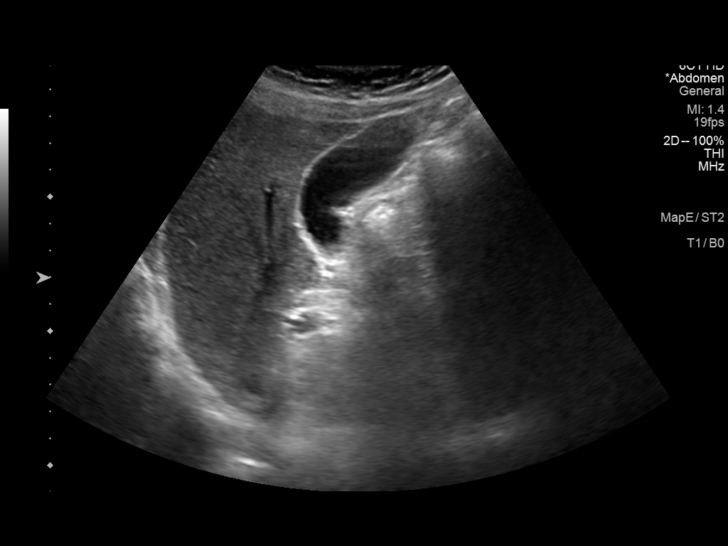
[im 18/48]
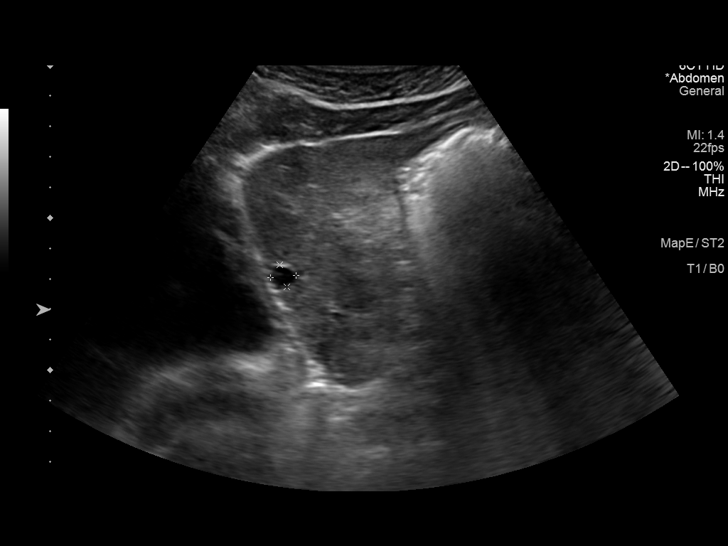
[im 22/48]
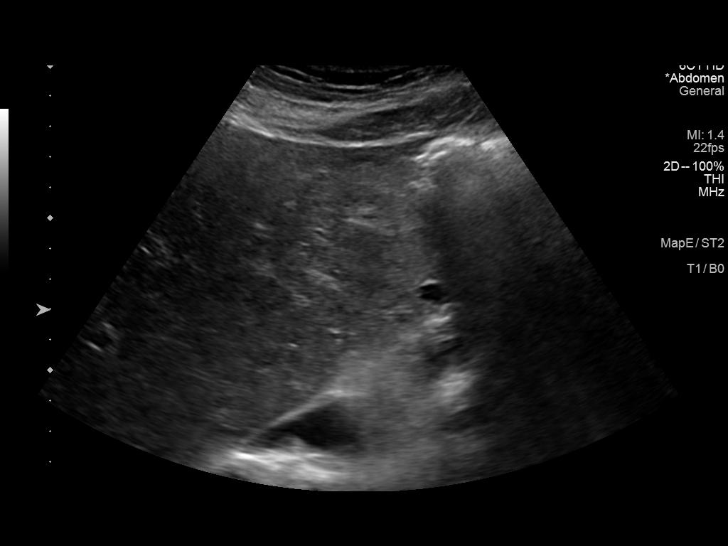
[im 26/48]
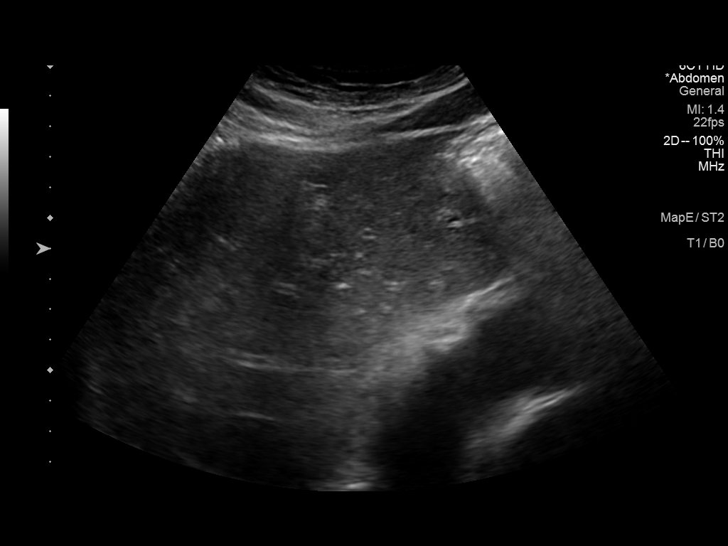
[im 30/48]
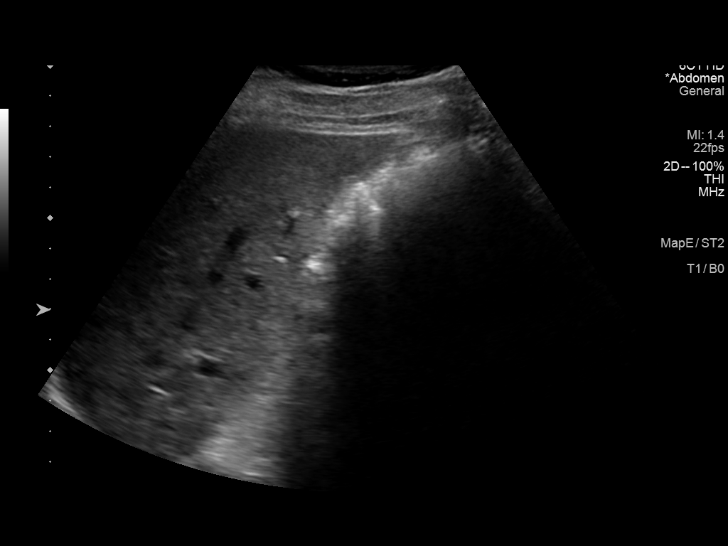
[im 32/48]
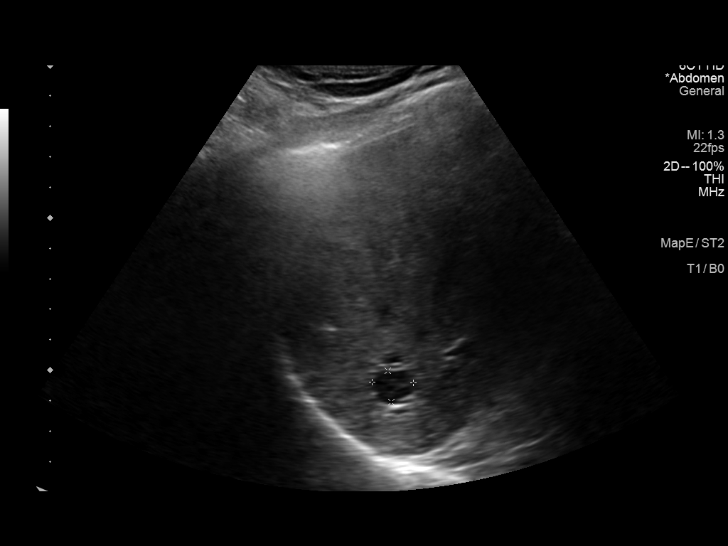
[im 36/48]
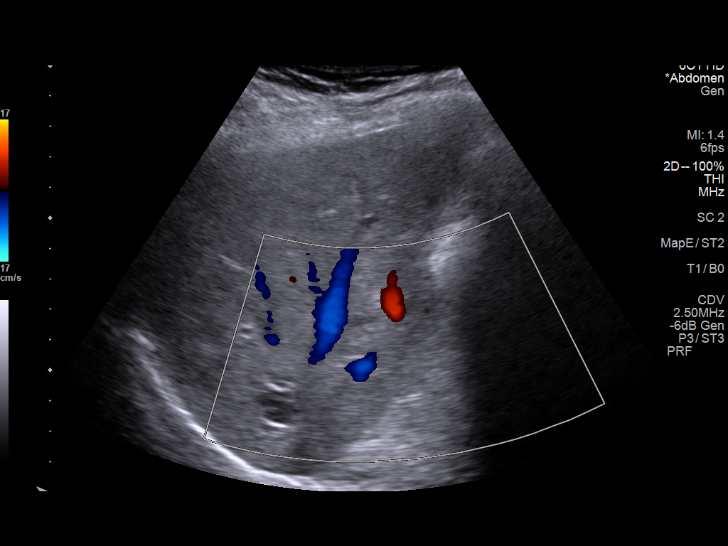
[im 40/48]
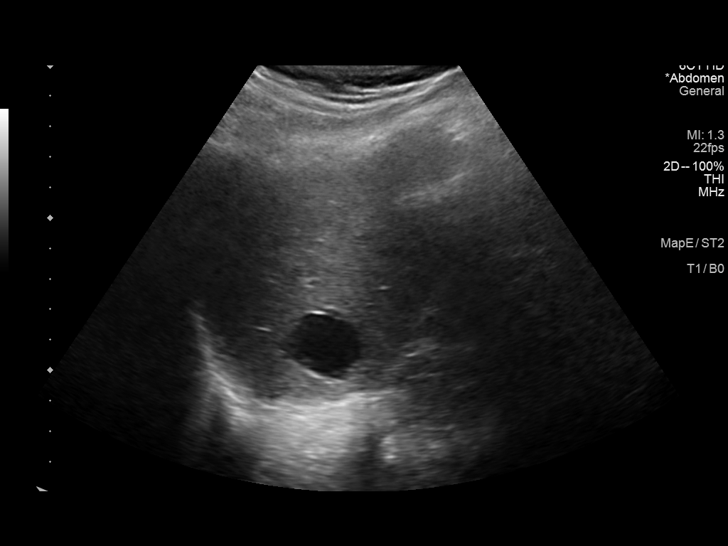
[im 44/48]
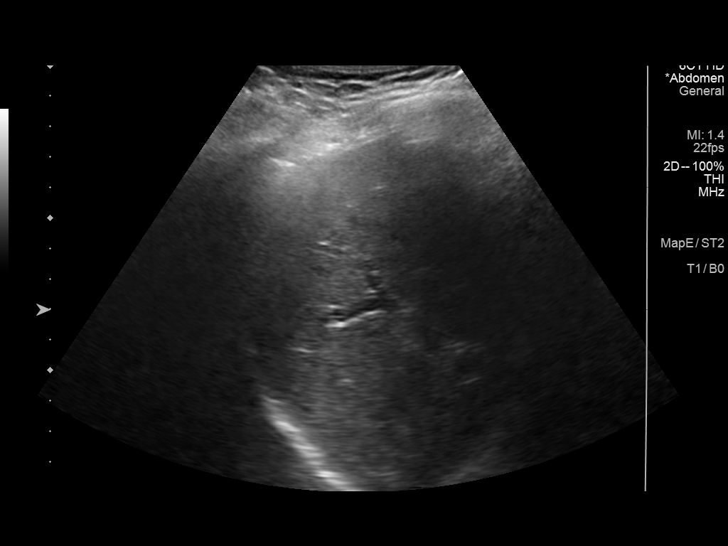
[im 48/48]
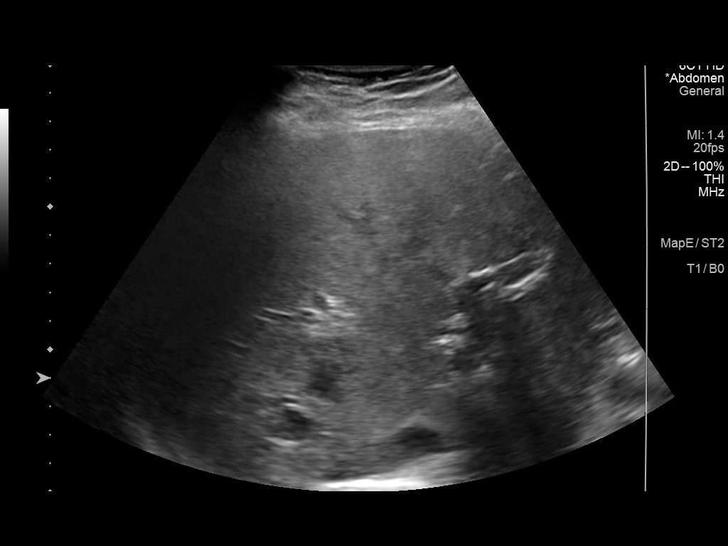

[14 of 25 positions shown; findings below may reference images not displayed]

FINDINGS: Gallbladder:

No gallstones or wall thickening visualized. No sonographic Murphy
sign noted by sonographer.

Common bile duct:

Diameter: 3 mm which is within normal limits.

Liver:

Increased echogenicity of hepatic parenchyma is noted suggesting
hepatic steatosis. At least 3 cysts are noted, with the largest
measuring 2.4 cm. These are not significantly changed. Portal vein
is patent on color Doppler imaging with normal direction of blood
flow towards the liver.

Other: None.
IMPRESSION: Increased echogenicity of hepatic parenchyma is noted suggesting
hepatic steatosis. Stable hepatic cysts.

## 2021-05-09 ENCOUNTER — Inpatient Hospital Stay: Payer: Medicare PPO

## 2021-05-09 ENCOUNTER — Other Ambulatory Visit: Payer: Self-pay

## 2021-05-09 ENCOUNTER — Inpatient Hospital Stay: Payer: Medicare PPO | Attending: Oncology | Admitting: Oncology

## 2021-05-09 VITALS — BP 116/78 | HR 85 | Temp 97.8°F | Resp 18 | Ht 73.0 in | Wt 196.2 lb

## 2021-05-09 DIAGNOSIS — D696 Thrombocytopenia, unspecified: Secondary | ICD-10-CM | POA: Diagnosis not present

## 2021-05-09 LAB — CMP (CANCER CENTER ONLY)
ALT: 18 U/L (ref 0–44)
AST: 19 U/L (ref 15–41)
Albumin: 4.3 g/dL (ref 3.5–5.0)
Alkaline Phosphatase: 60 U/L (ref 38–126)
Anion gap: 6 (ref 5–15)
BUN: 17 mg/dL (ref 8–23)
CO2: 27 mmol/L (ref 22–32)
Calcium: 9.1 mg/dL (ref 8.9–10.3)
Chloride: 106 mmol/L (ref 98–111)
Creatinine: 0.98 mg/dL (ref 0.61–1.24)
GFR, Estimated: >60 mL/min (ref >60)
Glucose, Bld: 82 mg/dL (ref 70–99)
Potassium: 3.9 mmol/L (ref 3.5–5.1)
Sodium: 139 mmol/L (ref 135–145)
Total Bilirubin: 0.4 mg/dL (ref 0.3–1.2)
Total Protein: 6.8 g/dL (ref 6.5–8.1)

## 2021-05-09 LAB — CBC WITH DIFFERENTIAL (CANCER CENTER ONLY)
Abs Immature Granulocytes: 0.01 10*3/uL (ref 0.00–0.07)
Basophils Absolute: 0 10*3/uL (ref 0.0–0.1)
Basophils Relative: 1 %
Eosinophils Absolute: 0.2 10*3/uL (ref 0.0–0.5)
Eosinophils Relative: 2 %
HCT: 43.6 % (ref 39.0–52.0)
Hemoglobin: 15.1 g/dL (ref 13.0–17.0)
Immature Granulocytes: 0 %
Lymphocytes Relative: 25 %
Lymphs Abs: 1.7 10*3/uL (ref 0.7–4.0)
MCH: 31.1 pg (ref 26.0–34.0)
MCHC: 34.6 g/dL (ref 30.0–36.0)
MCV: 89.7 fL (ref 80.0–100.0)
Monocytes Absolute: 0.6 10*3/uL (ref 0.1–1.0)
Monocytes Relative: 9 %
Neutro Abs: 4.1 10*3/uL (ref 1.7–7.7)
Neutrophils Relative %: 63 %
Platelet Count: 188 10*3/uL (ref 150–400)
RBC: 4.86 MIL/uL (ref 4.22–5.81)
RDW: 12.1 % (ref 11.5–15.5)
WBC Count: 6.6 10*3/uL (ref 4.0–10.5)
nRBC: 0 % (ref 0.0–0.2)

## 2021-05-09 NOTE — Progress Notes (Signed)
  Andre Vega   Diagnosis: History of thrombocytopenia  INTERVAL HISTORY:   Andre Vega returns as scheduled.  He feels well.  Good appetite.  He reports intentional weight loss by not drinking alcohol.  He is exercising.  No fever or night sweats.  Objective:  Vital signs in last 24 hours:  Blood pressure 116/78, pulse 85, temperature 97.8 F (36.6 C), temperature source Oral, resp. rate 18, height 6' 1"  (1.854 m), weight 196 lb 3.2 oz (89 kg), SpO2 100 %.     Lymphatics: No cervical, supraclavicular, axillary, or inguinal nodes Resp: Lungs clear bilaterally Cardio: Regular rate and rhythm GI: No hepatosplenomegaly, nontender, no mass Vascular: No leg edema  Lab Results:  Lab Results  Component Value Date   WBC 6.6 05/09/2021   HGB 15.1 05/09/2021   HCT 43.6 05/09/2021   MCV 89.7 05/09/2021   PLT 188 05/09/2021   NEUTROABS 4.1 05/09/2021    CMP  Lab Results  Component Value Date   NA 139 05/09/2021   K 3.9 05/09/2021   CL 106 05/09/2021   CO2 27 05/09/2021   GLUCOSE 82 05/09/2021   BUN 17 05/09/2021   CREATININE 0.98 05/09/2021   CALCIUM 9.1 05/09/2021   PROT 6.8 05/09/2021   ALBUMIN 4.3 05/09/2021   AST 19 05/09/2021   ALT 18 05/09/2021   ALKPHOS 60 05/09/2021   BILITOT 0.4 05/09/2021   GFRNONAA >60 05/09/2021   GFRAA >60 11/16/2019      Medications: I have reviewed the patient's current medications.   Assessment/Plan:  Thrombocytopenia 02/04/2018 platelet count 19,000 Solu-Medrol 80 mg IV 02/04/2018 Prednisone 60 mg daily beginning 02/05/2018 02/08/2018 platelet count 45,000 Prednisone 40 mg daily beginning 02/11/2018 02/15/2018 platelet count 92,000 02/22/2018 platelet count 200,000 Prednisone 20 mg daily beginning 02/23/2018 Prednisone 15 mg daily beginning 03/09/2018 Prednisone  10 mg daily beginning 03/22/2018 Prednisone 5 mg daily beginning 05/03/2018 Prednisone 2.5 mg daily beginning 05/24/2018 Prednisone 2.5  mg every other day, taper to off 07/27/2018 History of moderate to heavy alcohol use Malaise 02/04/2018 mono screen negative; EBV IgG positive, EBV IgM negative     Disposition: 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  He will call for bruising or bleeding.  He will return for an office and lab visit in 6 months.  [name], MD  05/09/2021  3:20 PM

## 2021-10-14 ENCOUNTER — Telehealth: Payer: Self-pay

## 2021-10-14 NOTE — Telephone Encounter (Signed)
Patient called and left a message to confirm his MD appointment. Called and spoke with the patient to let him know he is schedule for 6 mouth follow-up with MD 11/07/21 at 2 and mailed out appointment reminder to him. Patient gave verbal understanding and denied further questions. ?

## 2021-10-16 ENCOUNTER — Other Ambulatory Visit: Payer: Self-pay

## 2021-10-16 DIAGNOSIS — D696 Thrombocytopenia, unspecified: Secondary | ICD-10-CM

## 2021-11-07 ENCOUNTER — Ambulatory Visit: Payer: Medicare PPO | Admitting: Oncology

## 2021-11-07 ENCOUNTER — Other Ambulatory Visit: Payer: Medicare PPO

## 2021-11-12 ENCOUNTER — Inpatient Hospital Stay: Payer: Medicare PPO | Admitting: Oncology

## 2021-11-12 ENCOUNTER — Inpatient Hospital Stay: Payer: Medicare PPO | Attending: Oncology

## 2021-11-12 VITALS — BP 117/82 | HR 84 | Temp 98.2°F | Resp 18 | Ht 73.0 in | Wt 194.0 lb

## 2021-11-12 DIAGNOSIS — D696 Thrombocytopenia, unspecified: Secondary | ICD-10-CM | POA: Diagnosis present

## 2021-11-12 LAB — CBC WITH DIFFERENTIAL (CANCER CENTER ONLY)
Abs Immature Granulocytes: 0.01 10*3/uL (ref 0.00–0.07)
Basophils Absolute: 0 10*3/uL (ref 0.0–0.1)
Basophils Relative: 1 %
Eosinophils Absolute: 0.2 10*3/uL (ref 0.0–0.5)
Eosinophils Relative: 2 %
HCT: 46.2 % (ref 39.0–52.0)
Hemoglobin: 16 g/dL (ref 13.0–17.0)
Immature Granulocytes: 0 %
Lymphocytes Relative: 25 %
Lymphs Abs: 1.6 10*3/uL (ref 0.7–4.0)
MCH: 31.6 pg (ref 26.0–34.0)
MCHC: 34.6 g/dL (ref 30.0–36.0)
MCV: 91.1 fL (ref 80.0–100.0)
Monocytes Absolute: 0.7 10*3/uL (ref 0.1–1.0)
Monocytes Relative: 11 %
Neutro Abs: 3.9 10*3/uL (ref 1.7–7.7)
Neutrophils Relative %: 61 %
Platelet Count: 187 10*3/uL (ref 150–400)
RBC: 5.07 MIL/uL (ref 4.22–5.81)
RDW: 12.3 % (ref 11.5–15.5)
WBC Count: 6.3 10*3/uL (ref 4.0–10.5)
nRBC: 0 % (ref 0.0–0.2)

## 2021-11-12 NOTE — Progress Notes (Signed)
?  [place] ?OFFICE PROGRESS NOTE ? ? ?Diagnosis: Thrombocytopenia ? ?INTERVAL HISTORY:  ? ?Andre Vega returns as scheduled.  He feels well.  He is exercising.  He has persistent arthralgias.  No new complaint. ? ?Objective: ? ?Vital signs in last 24 hours: ? ?Blood pressure 117/82, pulse 84, temperature 98.2 ?F (36.8 ?C), temperature source Oral, resp. rate 18, height 6' 1"  (1.854 m), weight 194 lb (88 kg), SpO2 100 %. ?  ? ?Lymphatics: No cervical, supraclavicular, axillary, or inguinal nodes ?Resp: Clear bilaterally ?Cardio: Regular rate and rhythm ?GI: No hepatosplenomegaly ?Vascular: No leg edema ? ? ?Lab Results: ? ?Lab Results  ?Component Value Date  ? WBC 6.3 11/12/2021  ? HGB 16.0 11/12/2021  ? HCT 46.2 11/12/2021  ? MCV 91.1 11/12/2021  ? PLT 187 11/12/2021  ? NEUTROABS 3.9 11/12/2021  ? ? ?CMP  ?Lab Results  ?Component Value Date  ? NA 139 05/09/2021  ? K 3.9 05/09/2021  ? CL 106 05/09/2021  ? CO2 27 05/09/2021  ? GLUCOSE 82 05/09/2021  ? BUN 17 05/09/2021  ? CREATININE 0.98 05/09/2021  ? CALCIUM 9.1 05/09/2021  ? PROT 6.8 05/09/2021  ? ALBUMIN 4.3 05/09/2021  ? AST 19 05/09/2021  ? ALT 18 05/09/2021  ? ALKPHOS 60 05/09/2021  ? BILITOT 0.4 05/09/2021  ? GFRNONAA >60 05/09/2021  ? GFRAA >60 11/16/2019  ? ? ? ?Medications: I have reviewed the patient's current medications. ? ? ?Assessment/Plan: ?Thrombocytopenia ?02/04/2018 platelet count 19,000 ?Solu-Medrol 80 mg IV 02/04/2018 ?Prednisone 60 mg daily beginning 02/05/2018 ?02/08/2018 platelet count 45,000 ?Prednisone 40 mg daily beginning 02/11/2018 ?02/15/2018 platelet count 92,000 ?02/22/2018 platelet count 200,000 ?Prednisone 20 mg daily beginning 02/23/2018 ?Prednisone 15 mg daily beginning 03/09/2018 ?Prednisone  10 mg daily beginning 03/22/2018 ?Prednisone 5 mg daily beginning 05/03/2018 ?Prednisone 2.5 mg daily beginning 05/24/2018 ?Prednisone 2.5 mg every other day, taper to off 07/27/2018 ?History of moderate to heavy alcohol  use ?Malaise ?02/04/2018 mono screen negative; EBV IgG positive, EBV IgM negative ?  ? ? ? ? ?Disposition: ?[name] appears stable.  The platelet count remains in the normal range.  He has a remote history of severe thrombocytopenia, potentially related to ITP.  He will return for an office visit and CBC in 6 months. ? ?[name], MD ? ?11/12/2021  ?10:36 AM ? ? ?

## 2021-11-13 LAB — PROSTATE-SPECIFIC AG, SERUM (LABCORP): Prostate Specific Ag, Serum: 1.2 ng/mL (ref 0.0–4.0)

## 2022-05-12 ENCOUNTER — Inpatient Hospital Stay: Payer: Medicare PPO

## 2022-05-12 ENCOUNTER — Inpatient Hospital Stay: Payer: Medicare PPO | Admitting: Oncology

## 2022-06-26 ENCOUNTER — Other Ambulatory Visit: Payer: Medicare PPO

## 2022-06-26 ENCOUNTER — Ambulatory Visit: Payer: Medicare PPO | Admitting: Oncology

## 2022-07-08 ENCOUNTER — Encounter: Payer: Self-pay | Admitting: Oncology

## 2022-07-08 ENCOUNTER — Inpatient Hospital Stay: Payer: Medicare PPO | Attending: Oncology

## 2022-07-08 ENCOUNTER — Inpatient Hospital Stay: Payer: Medicare PPO | Admitting: Oncology

## 2022-07-08 VITALS — BP 107/73 | HR 64 | Temp 98.2°F | Resp 18 | Ht 73.0 in | Wt 193.0 lb

## 2022-07-08 DIAGNOSIS — M542 Cervicalgia: Secondary | ICD-10-CM | POA: Diagnosis not present

## 2022-07-08 DIAGNOSIS — G8929 Other chronic pain: Secondary | ICD-10-CM | POA: Insufficient documentation

## 2022-07-08 DIAGNOSIS — D696 Thrombocytopenia, unspecified: Secondary | ICD-10-CM

## 2022-07-08 LAB — CBC WITH DIFFERENTIAL (CANCER CENTER ONLY)
Abs Immature Granulocytes: 0.02 10*3/uL (ref 0.00–0.07)
Basophils Absolute: 0 10*3/uL (ref 0.0–0.1)
Basophils Relative: 1 %
Eosinophils Absolute: 0.2 10*3/uL (ref 0.0–0.5)
Eosinophils Relative: 3 %
HCT: 45.2 % (ref 39.0–52.0)
Hemoglobin: 15.8 g/dL (ref 13.0–17.0)
Immature Granulocytes: 0 %
Lymphocytes Relative: 25 %
Lymphs Abs: 1.7 10*3/uL (ref 0.7–4.0)
MCH: 31.9 pg (ref 26.0–34.0)
MCHC: 35 g/dL (ref 30.0–36.0)
MCV: 91.1 fL (ref 80.0–100.0)
Monocytes Absolute: 0.6 10*3/uL (ref 0.1–1.0)
Monocytes Relative: 9 %
Neutro Abs: 4.1 10*3/uL (ref 1.7–7.7)
Neutrophils Relative %: 62 %
Platelet Count: 209 10*3/uL (ref 150–400)
RBC: 4.96 MIL/uL (ref 4.22–5.81)
RDW: 12.3 % (ref 11.5–15.5)
WBC Count: 6.5 10*3/uL (ref 4.0–10.5)
nRBC: 0 % (ref 0.0–0.2)

## 2022-07-08 NOTE — Progress Notes (Signed)
  [facility] OFFICE PROGRESS NOTE   Diagnosis: Thrombocytopenia  INTERVAL HISTORY:   Mr. Sanjuan turns as scheduled.  He feels well.  Good appetite.  He reports a bruise at the site of trauma to left forearm.  No other bleeding aside from chronic "hemorrhoid "bleeding.  He reports the hemorrhoids have been evaluated by Dr. Stephanie Acre. He has chronic neck pain.  He walks for exercise. Objective:  Vital signs in last 24 hours:  Blood pressure 107/73, pulse 64, temperature 98.2 F (36.8 C), temperature source Oral, resp. rate 18, height 6' 1"  (1.854 m), weight 193 lb (87.5 kg), SpO2 100 %.    Lymphatics: No cervical, supraclavicular, axillary, or inguinal nodes Resp: Lungs clear bilaterally Cardio: Regular rate and rhythm GI: No hepatosplenomegaly Vascular: No leg edema  Skin: 1-1.5 cm oval superficial abrasion with purple discoloration at the dorsum of the left arm   Lab Results:  Lab Results  Component Value Date   WBC 6.5 07/08/2022   HGB 15.8 07/08/2022   HCT 45.2 07/08/2022   MCV 91.1 07/08/2022   PLT 209 07/08/2022   NEUTROABS 4.1 07/08/2022    CMP  Lab Results  Component Value Date   NA 139 05/09/2021   K 3.9 05/09/2021   CL 106 05/09/2021   CO2 27 05/09/2021   GLUCOSE 82 05/09/2021   BUN 17 05/09/2021   CREATININE 0.98 05/09/2021   CALCIUM 9.1 05/09/2021   PROT 6.8 05/09/2021   ALBUMIN 4.3 05/09/2021   AST 19 05/09/2021   ALT 18 05/09/2021   ALKPHOS 60 05/09/2021   BILITOT 0.4 05/09/2021   GFRNONAA >60 05/09/2021   GFRAA >60 11/16/2019    No results found for: "CEA1", "CEA", "CAN199", "CA125"  Lab Results  Component Value Date   INR 0.97 02/04/2018   LABPROT 12.8 02/04/2018    Imaging:  No results found.  Medications: I have reviewed the patient's current medications.   Assessment/Plan: Thrombocytopenia 02/04/2018 platelet count 19,000 Solu-Medrol 80 mg IV 02/04/2018 Prednisone 60 mg daily beginning 02/05/2018 02/08/2018  platelet count 45,000 Prednisone 40 mg daily beginning 02/11/2018 02/15/2018 platelet count 92,000 02/22/2018 platelet count 200,000 Prednisone 20 mg daily beginning 02/23/2018 Prednisone 15 mg daily beginning 03/09/2018 Prednisone  10 mg daily beginning 03/22/2018 Prednisone 5 mg daily beginning 05/03/2018 Prednisone 2.5 mg daily beginning 05/24/2018 Prednisone 2.5 mg every other day, taper to off 07/27/2018 History of moderate to heavy alcohol use Malaise 02/04/2018 mono screen negative; EBV IgG positive, EBV IgM negative       Disposition: Mr. Pursel remains stable from a hematologic standpoint.  The platelet count remains in the normal range.  He will call for spontaneous bleeding or bruising.  The lesion at the left forearm appears to be related to a traumatic abrasion/ecchymosis versus bruising associated with a superficial skin lesion.  I recommended he follow-up with his primary provider or dermatologist if the lesion does not resolve.  He would like to continue follow-up in the hematology clinic.  He will return for an office visit and CBC in 6 months.  [name], MD  07/08/2022  3:08 PM

## 2022-12-26 ENCOUNTER — Telehealth: Payer: Self-pay | Admitting: Oncology

## 2022-12-26 NOTE — Telephone Encounter (Signed)
Patient aware of upcoming appointments  

## 2023-01-08 ENCOUNTER — Inpatient Hospital Stay: Payer: Medicare PPO | Attending: Oncology

## 2023-01-08 ENCOUNTER — Inpatient Hospital Stay: Payer: Medicare PPO | Admitting: Oncology

## 2023-01-08 ENCOUNTER — Other Ambulatory Visit: Payer: Self-pay | Admitting: *Deleted

## 2023-01-08 VITALS — BP 115/71 | HR 60 | Temp 97.9°F | Resp 18 | Ht 73.0 in | Wt 191.9 lb

## 2023-01-08 DIAGNOSIS — D696 Thrombocytopenia, unspecified: Secondary | ICD-10-CM

## 2023-01-08 DIAGNOSIS — R5381 Other malaise: Secondary | ICD-10-CM | POA: Insufficient documentation

## 2023-01-08 LAB — CBC WITH DIFFERENTIAL (CANCER CENTER ONLY)
Abs Immature Granulocytes: 0.01 10*3/uL (ref 0.00–0.07)
Basophils Absolute: 0 10*3/uL (ref 0.0–0.1)
Basophils Relative: 1 %
Eosinophils Absolute: 0.1 10*3/uL (ref 0.0–0.5)
Eosinophils Relative: 2 %
HCT: 43.5 % (ref 39.0–52.0)
Hemoglobin: 15.5 g/dL (ref 13.0–17.0)
Immature Granulocytes: 0 %
Lymphocytes Relative: 24 %
Lymphs Abs: 1.4 10*3/uL (ref 0.7–4.0)
MCH: 32.2 pg (ref 26.0–34.0)
MCHC: 35.6 g/dL (ref 30.0–36.0)
MCV: 90.2 fL (ref 80.0–100.0)
Monocytes Absolute: 0.5 10*3/uL (ref 0.1–1.0)
Monocytes Relative: 9 %
Neutro Abs: 3.8 10*3/uL (ref 1.7–7.7)
Neutrophils Relative %: 64 %
Platelet Count: 186 10*3/uL (ref 150–400)
RBC: 4.82 MIL/uL (ref 4.22–5.81)
RDW: 12.1 % (ref 11.5–15.5)
WBC Count: 5.9 10*3/uL (ref 4.0–10.5)
nRBC: 0 % (ref 0.0–0.2)

## 2023-01-08 LAB — CMP (CANCER CENTER ONLY)
ALT: 27 U/L (ref 0–44)
AST: 23 U/L (ref 15–41)
Albumin: 4.4 g/dL (ref 3.5–5.0)
Alkaline Phosphatase: 61 U/L (ref 38–126)
Anion gap: 6 (ref 5–15)
BUN: 20 mg/dL (ref 8–23)
CO2: 28 mmol/L (ref 22–32)
Calcium: 9.2 mg/dL (ref 8.9–10.3)
Chloride: 106 mmol/L (ref 98–111)
Creatinine: 1.05 mg/dL (ref 0.61–1.24)
GFR, Estimated: >60 mL/min (ref >60)
Glucose, Bld: 87 mg/dL (ref 70–99)
Potassium: 4 mmol/L (ref 3.5–5.1)
Sodium: 140 mmol/L (ref 135–145)
Total Bilirubin: 0.5 mg/dL (ref 0.3–1.2)
Total Protein: 6.8 g/dL (ref 6.5–8.1)

## 2023-01-08 MED ORDER — EPINEPHRINE 0.3 MG/0.3ML IJ SOAJ: 0.3000 mg | INTRAMUSCULAR | 1 refills | Status: AC | PRN

## 2023-01-08 NOTE — Progress Notes (Signed)
  [facility] OFFICE PROGRESS NOTE   Diagnosis: Thrombocytopenia  INTERVAL HISTORY:   Dr. Josem Kaufmann returns as scheduled.  He feels well.  Good appetite.  He reports intentional weight loss.  He exercises.  No bleeding.  He remains off of alcohol.  Objective:  Vital signs in last 24 hours:  Blood pressure 115/71, pulse 60, temperature 97.9 F (36.6 C), temperature source Oral, resp. rate 18, height 6\' 1"  (1.854 m), weight 191 lb 14.4 oz (87 kg), SpO2 100 %.   Lymphatics: No cervical, supraclavicular, axillary, or inguinal nodes Resp: Lungs clear bilaterally Cardio: Regular rate and rhythm GI: No hepatosplenomegaly Vascular: No leg edema   Lab Results:  Lab Results  Component Value Date   WBC 5.9 01/08/2023   HGB 15.5 01/08/2023   HCT 43.5 01/08/2023   MCV 90.2 01/08/2023   PLT 186 01/08/2023   NEUTROABS 3.8 01/08/2023    CMP  Lab Results  Component Value Date   NA 140 01/08/2023   K 4.0 01/08/2023   CL 106 01/08/2023   CO2 28 01/08/2023   GLUCOSE 87 01/08/2023   BUN 20 01/08/2023   CREATININE 1.05 01/08/2023   CALCIUM 9.2 01/08/2023   PROT 6.8 01/08/2023   ALBUMIN 4.4 01/08/2023   AST 23 01/08/2023   ALT 27 01/08/2023   ALKPHOS 61 01/08/2023   BILITOT 0.5 01/08/2023   GFRNONAA >60 01/08/2023   GFRAA >60 11/16/2019    Medications: I have reviewed the patient's current medications.   Assessment/Plan: Thrombocytopenia 02/04/2018 platelet count 19,000 Solu-Medrol 80 mg IV 02/04/2018 Prednisone 60 mg daily beginning 02/05/2018 02/08/2018 platelet count 45,000 Prednisone 40 mg daily beginning 02/11/2018 02/15/2018 platelet count 92,000 02/22/2018 platelet count 200,000 Prednisone 20 mg daily beginning 02/23/2018 Prednisone 15 mg daily beginning 03/09/2018 Prednisone  10 mg daily beginning 03/22/2018 Prednisone 5 mg daily beginning 05/03/2018 Prednisone 2.5 mg daily beginning 05/24/2018 Prednisone 2.5 mg every other day, taper to off  07/27/2018 History of moderate to heavy alcohol use Malaise 02/04/2018 mono screen negative; EBV IgG positive, EBV IgM negative    Disposition: Mr Harju appears stable.  The platelet count remains in the normal range.  He would like to continue follow-up in the hematology clinic.  He will return for an office and lab visit in 6 months.  Thornton Papas, MD  01/08/2023  3:42 PM

## 2023-04-22 DIAGNOSIS — Z125 Encounter for screening for malignant neoplasm of prostate: Secondary | ICD-10-CM | POA: Diagnosis not present

## 2023-04-22 DIAGNOSIS — R7301 Impaired fasting glucose: Secondary | ICD-10-CM | POA: Diagnosis not present

## 2023-04-22 DIAGNOSIS — E559 Vitamin D deficiency, unspecified: Secondary | ICD-10-CM | POA: Diagnosis not present

## 2023-04-22 DIAGNOSIS — D696 Thrombocytopenia, unspecified: Secondary | ICD-10-CM | POA: Diagnosis not present

## 2023-04-22 DIAGNOSIS — E7800 Pure hypercholesterolemia, unspecified: Secondary | ICD-10-CM | POA: Diagnosis not present

## 2023-04-22 DIAGNOSIS — Z79899 Other long term (current) drug therapy: Secondary | ICD-10-CM | POA: Diagnosis not present

## 2023-04-22 DIAGNOSIS — K760 Fatty (change of) liver, not elsewhere classified: Secondary | ICD-10-CM | POA: Diagnosis not present

## 2023-04-30 DIAGNOSIS — D693 Immune thrombocytopenic purpura: Secondary | ICD-10-CM | POA: Diagnosis not present

## 2023-04-30 DIAGNOSIS — R972 Elevated prostate specific antigen [PSA]: Secondary | ICD-10-CM | POA: Diagnosis not present

## 2023-04-30 DIAGNOSIS — F3342 Major depressive disorder, recurrent, in full remission: Secondary | ICD-10-CM | POA: Diagnosis not present

## 2023-04-30 DIAGNOSIS — L649 Androgenic alopecia, unspecified: Secondary | ICD-10-CM | POA: Diagnosis not present

## 2023-04-30 DIAGNOSIS — I700 Atherosclerosis of aorta: Secondary | ICD-10-CM | POA: Diagnosis not present

## 2023-04-30 DIAGNOSIS — E7800 Pure hypercholesterolemia, unspecified: Secondary | ICD-10-CM | POA: Diagnosis not present

## 2023-04-30 DIAGNOSIS — Z0000 Encounter for general adult medical examination without abnormal findings: Secondary | ICD-10-CM | POA: Diagnosis not present

## 2023-05-11 DIAGNOSIS — N433 Hydrocele, unspecified: Secondary | ICD-10-CM | POA: Diagnosis not present

## 2023-05-11 DIAGNOSIS — R972 Elevated prostate specific antigen [PSA]: Secondary | ICD-10-CM | POA: Diagnosis not present

## 2023-05-11 DIAGNOSIS — N400 Enlarged prostate without lower urinary tract symptoms: Secondary | ICD-10-CM | POA: Diagnosis not present

## 2023-05-28 ENCOUNTER — Encounter: Payer: Self-pay | Admitting: Family Medicine

## 2023-06-30 ENCOUNTER — Inpatient Hospital Stay: Payer: Medicare PPO

## 2023-06-30 ENCOUNTER — Other Ambulatory Visit: Payer: Self-pay

## 2023-06-30 ENCOUNTER — Inpatient Hospital Stay: Payer: Medicare PPO | Admitting: Oncology

## 2023-07-09 DIAGNOSIS — J069 Acute upper respiratory infection, unspecified: Secondary | ICD-10-CM | POA: Diagnosis not present

## 2023-07-09 DIAGNOSIS — Z20822 Contact with and (suspected) exposure to covid-19: Secondary | ICD-10-CM | POA: Diagnosis not present

## 2023-07-09 DIAGNOSIS — R051 Acute cough: Secondary | ICD-10-CM | POA: Diagnosis not present

## 2023-07-14 DIAGNOSIS — Z6825 Body mass index (BMI) 25.0-25.9, adult: Secondary | ICD-10-CM | POA: Diagnosis not present

## 2023-07-14 DIAGNOSIS — H10023 Other mucopurulent conjunctivitis, bilateral: Secondary | ICD-10-CM | POA: Diagnosis not present

## 2023-07-14 DIAGNOSIS — R051 Acute cough: Secondary | ICD-10-CM | POA: Diagnosis not present

## 2023-07-14 DIAGNOSIS — Z131 Encounter for screening for diabetes mellitus: Secondary | ICD-10-CM | POA: Diagnosis not present

## 2023-07-14 DIAGNOSIS — Z789 Other specified health status: Secondary | ICD-10-CM | POA: Diagnosis not present

## 2023-07-14 DIAGNOSIS — Z13220 Encounter for screening for lipoid disorders: Secondary | ICD-10-CM | POA: Diagnosis not present

## 2023-07-21 ENCOUNTER — Other Ambulatory Visit: Payer: Medicare PPO

## 2023-07-21 ENCOUNTER — Ambulatory Visit: Payer: Medicare PPO | Admitting: Oncology

## 2023-09-01 ENCOUNTER — Inpatient Hospital Stay: Payer: Medicare PPO | Attending: Oncology

## 2023-09-01 ENCOUNTER — Inpatient Hospital Stay: Payer: Medicare PPO | Admitting: Oncology

## 2023-09-01 VITALS — BP 113/75 | HR 60 | Temp 98.1°F | Resp 18 | Ht 73.0 in | Wt 194.1 lb

## 2023-09-01 DIAGNOSIS — D696 Thrombocytopenia, unspecified: Secondary | ICD-10-CM | POA: Diagnosis not present

## 2023-09-01 DIAGNOSIS — M5030 Other cervical disc degeneration, unspecified cervical region: Secondary | ICD-10-CM | POA: Insufficient documentation

## 2023-09-01 LAB — CMP (CANCER CENTER ONLY)
ALT: 28 U/L (ref 0–44)
AST: 26 U/L (ref 15–41)
Albumin: 4.2 g/dL (ref 3.5–5.0)
Alkaline Phosphatase: 59 U/L (ref 38–126)
Anion gap: 8 (ref 5–15)
BUN: 16 mg/dL (ref 8–23)
CO2: 26 mmol/L (ref 22–32)
Calcium: 9.1 mg/dL (ref 8.9–10.3)
Chloride: 105 mmol/L (ref 98–111)
Creatinine: 0.95 mg/dL (ref 0.61–1.24)
GFR, Estimated: >60 mL/min (ref >60)
Glucose, Bld: 79 mg/dL (ref 70–99)
Potassium: 3.8 mmol/L (ref 3.5–5.1)
Sodium: 139 mmol/L (ref 135–145)
Total Bilirubin: 0.4 mg/dL (ref 0.0–1.2)
Total Protein: 6.8 g/dL (ref 6.5–8.1)

## 2023-09-01 LAB — CBC WITH DIFFERENTIAL (CANCER CENTER ONLY)
Abs Immature Granulocytes: 0.01 10*3/uL (ref 0.00–0.07)
Basophils Absolute: 0 10*3/uL (ref 0.0–0.1)
Basophils Relative: 1 %
Eosinophils Absolute: 0.2 10*3/uL (ref 0.0–0.5)
Eosinophils Relative: 3 %
HCT: 44 % (ref 39.0–52.0)
Hemoglobin: 15.3 g/dL (ref 13.0–17.0)
Immature Granulocytes: 0 %
Lymphocytes Relative: 25 %
Lymphs Abs: 1.6 10*3/uL (ref 0.7–4.0)
MCH: 31.7 pg (ref 26.0–34.0)
MCHC: 34.8 g/dL (ref 30.0–36.0)
MCV: 91.3 fL (ref 80.0–100.0)
Monocytes Absolute: 0.6 10*3/uL (ref 0.1–1.0)
Monocytes Relative: 10 %
Neutro Abs: 3.9 10*3/uL (ref 1.7–7.7)
Neutrophils Relative %: 61 %
Platelet Count: 191 10*3/uL (ref 150–400)
RBC: 4.82 MIL/uL (ref 4.22–5.81)
RDW: 12.2 % (ref 11.5–15.5)
WBC Count: 6.4 10*3/uL (ref 4.0–10.5)
nRBC: 0 % (ref 0.0–0.2)

## 2023-09-01 NOTE — Progress Notes (Signed)
  [facility] OFFICE PROGRESS NOTE   Diagnosis: Thrombocytopenia  INTERVAL HISTORY:   Andre Vega turns as scheduled.  He feels well.  Good appetite.  He is exercising.  No bleeding.  No fever or night sweats.  He reports neck discomfort and intermittent right arm numbness.  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  He was referred to urology to evaluate a rise in the PSA.  The PSA has remained in the normal range.  He has a follow-up appointment with urology this month.  He continues to abstain from alcohol.  Objective:  Vital signs in last 24 hours:  Blood pressure 113/75, pulse 60, temperature 98.1 F (36.7 C), temperature source Temporal, resp. rate 18, height 6' 1 (1.854 m), weight 194 lb 1.6 oz (88 kg), SpO2 100%.   Lymphatics: No cervical, supraclavicular, axillary, or inguinal nodes Resp: Clear bilaterally Cardio: Regular rate and rhythm GI: No hepatosplenomegaly, no mass, nontender Vascular: No leg edema  Lab Results:  Lab Results  Component Value Date   WBC 6.4 09/01/2023   HGB 15.3 09/01/2023   HCT 44.0 09/01/2023   MCV 91.3 09/01/2023   PLT 191 09/01/2023   NEUTROABS 3.9 09/01/2023    CMP  Lab Results  Component Value Date   NA 139 09/01/2023   K 3.8 09/01/2023   CL 105 09/01/2023   CO2 26 09/01/2023   GLUCOSE 79 09/01/2023   BUN 16 09/01/2023   CREATININE 0.95 09/01/2023   CALCIUM 9.1 09/01/2023   PROT 6.8 09/01/2023   ALBUMIN 4.2 09/01/2023   AST 26 09/01/2023   ALT 28 09/01/2023   ALKPHOS 59 09/01/2023   BILITOT 0.4 09/01/2023   GFRNONAA >60 09/01/2023   GFRAA >60 11/16/2019    Medications: I have reviewed the patient's current medications.   Assessment/Plan:  Thrombocytopenia 02/04/2018 platelet count 19,000 Solu-Medrol  80 mg IV 02/04/2018 Prednisone  60 mg daily beginning 02/05/2018 02/08/2018 platelet count 45,000 Prednisone  40 mg daily beginning  02/11/2018 02/15/2018 platelet count 92,000 02/22/2018 platelet count 200,000 Prednisone  20 mg daily beginning 02/23/2018 Prednisone  15 mg daily beginning 03/09/2018 Prednisone   10 mg daily beginning 03/22/2018 Prednisone  5 mg daily beginning 05/03/2018 Prednisone  2.5 mg daily beginning 05/24/2018 Prednisone  2.5 mg every other day, taper to off 07/27/2018 History of moderate to heavy alcohol use Malaise 02/04/2018 mono screen negative; EBV IgG positive, EBV IgM negative     Disposition: Andre Vega appears stable.  The platelet count remains in the normal range.  The etiology of the thrombocytopenia in 2019 remains unclear.  This may have been related to ITP or an effect of alcohol use.  There is no evidence for development of a lymphoproliferative disorder.  He would like to continue every 90-month follow-up in the hematology clinic.  He will return for an office visit and CBC in 6 months.  Arley Hof, MD  09/01/2023  3:10 PM

## 2023-09-14 DIAGNOSIS — R972 Elevated prostate specific antigen [PSA]: Secondary | ICD-10-CM | POA: Diagnosis not present

## 2023-09-14 DIAGNOSIS — N433 Hydrocele, unspecified: Secondary | ICD-10-CM | POA: Diagnosis not present

## 2023-10-26 DIAGNOSIS — D492 Neoplasm of unspecified behavior of bone, soft tissue, and skin: Secondary | ICD-10-CM | POA: Diagnosis not present

## 2023-10-26 DIAGNOSIS — L814 Other melanin hyperpigmentation: Secondary | ICD-10-CM | POA: Diagnosis not present

## 2023-10-26 DIAGNOSIS — L821 Other seborrheic keratosis: Secondary | ICD-10-CM | POA: Diagnosis not present

## 2023-10-26 DIAGNOSIS — L739 Follicular disorder, unspecified: Secondary | ICD-10-CM | POA: Diagnosis not present

## 2023-10-26 DIAGNOSIS — L738 Other specified follicular disorders: Secondary | ICD-10-CM | POA: Diagnosis not present

## 2023-10-26 DIAGNOSIS — D2262 Melanocytic nevi of left upper limb, including shoulder: Secondary | ICD-10-CM | POA: Diagnosis not present

## 2023-10-26 DIAGNOSIS — D1801 Hemangioma of skin and subcutaneous tissue: Secondary | ICD-10-CM | POA: Diagnosis not present

## 2024-01-07 DIAGNOSIS — Z79899 Other long term (current) drug therapy: Secondary | ICD-10-CM | POA: Diagnosis not present

## 2024-01-07 DIAGNOSIS — Z125 Encounter for screening for malignant neoplasm of prostate: Secondary | ICD-10-CM | POA: Diagnosis not present

## 2024-01-07 DIAGNOSIS — D693 Immune thrombocytopenic purpura: Secondary | ICD-10-CM | POA: Diagnosis not present

## 2024-02-15 DIAGNOSIS — H02831 Dermatochalasis of right upper eyelid: Secondary | ICD-10-CM | POA: Diagnosis not present

## 2024-02-15 DIAGNOSIS — H02834 Dermatochalasis of left upper eyelid: Secondary | ICD-10-CM | POA: Diagnosis not present

## 2024-02-15 DIAGNOSIS — H16223 Keratoconjunctivitis sicca, not specified as Sjogren's, bilateral: Secondary | ICD-10-CM | POA: Diagnosis not present

## 2024-03-01 ENCOUNTER — Other Ambulatory Visit: Payer: Self-pay | Admitting: *Deleted

## 2024-03-01 ENCOUNTER — Inpatient Hospital Stay: Payer: Medicare PPO | Attending: Oncology

## 2024-03-01 ENCOUNTER — Inpatient Hospital Stay: Payer: Medicare PPO | Admitting: Oncology

## 2024-03-01 VITALS — BP 130/78 | HR 62 | Temp 98.1°F | Resp 18 | Ht 73.0 in | Wt 195.9 lb

## 2024-03-01 DIAGNOSIS — R5381 Other malaise: Secondary | ICD-10-CM | POA: Insufficient documentation

## 2024-03-01 DIAGNOSIS — D696 Thrombocytopenia, unspecified: Secondary | ICD-10-CM

## 2024-03-01 LAB — CBC WITH DIFFERENTIAL (CANCER CENTER ONLY)
Abs Immature Granulocytes: 0.02 K/uL (ref 0.00–0.07)
Basophils Absolute: 0 K/uL (ref 0.0–0.1)
Basophils Relative: 1 %
Eosinophils Absolute: 0.1 K/uL (ref 0.0–0.5)
Eosinophils Relative: 2 %
HCT: 47.2 % (ref 39.0–52.0)
Hemoglobin: 16.4 g/dL (ref 13.0–17.0)
Immature Granulocytes: 0 %
Lymphocytes Relative: 20 %
Lymphs Abs: 1.2 K/uL (ref 0.7–4.0)
MCH: 32.2 pg (ref 26.0–34.0)
MCHC: 34.7 g/dL (ref 30.0–36.0)
MCV: 92.7 fL (ref 80.0–100.0)
Monocytes Absolute: 0.6 K/uL (ref 0.1–1.0)
Monocytes Relative: 9 %
Neutro Abs: 4.2 K/uL (ref 1.7–7.7)
Neutrophils Relative %: 68 %
Platelet Count: 191 K/uL (ref 150–400)
RBC: 5.09 MIL/uL (ref 4.22–5.81)
RDW: 12.3 % (ref 11.5–15.5)
WBC Count: 6.2 K/uL (ref 4.0–10.5)
nRBC: 0 % (ref 0.0–0.2)

## 2024-03-01 LAB — CMP (CANCER CENTER ONLY)
ALT: 40 U/L (ref 0–44)
AST: 37 U/L (ref 15–41)
Albumin: 4.8 g/dL (ref 3.5–5.0)
Alkaline Phosphatase: 90 U/L (ref 38–126)
Anion gap: 10 (ref 5–15)
BUN: 14 mg/dL (ref 8–23)
CO2: 27 mmol/L (ref 22–32)
Calcium: 10.1 mg/dL (ref 8.9–10.3)
Chloride: 103 mmol/L (ref 98–111)
Creatinine: 1.09 mg/dL (ref 0.61–1.24)
GFR, Estimated: >60 mL/min (ref >60)
Glucose, Bld: 88 mg/dL (ref 70–99)
Potassium: 4.5 mmol/L (ref 3.5–5.1)
Sodium: 140 mmol/L (ref 135–145)
Total Bilirubin: 0.3 mg/dL (ref 0.0–1.2)
Total Protein: 7.3 g/dL (ref 6.5–8.1)

## 2024-03-01 LAB — TSH: TSH: 1.37 u[IU]/mL (ref 0.350–4.500)

## 2024-03-01 NOTE — Progress Notes (Signed)
  [facility] OFFICE PROGRESS NOTE   Diagnosis: Thrombocytopenia  INTERVAL HISTORY:   Mr. Line returns as scheduled.  He feels well.  He reports arthritis discomfort.  He is exercising by walking and lifting weights.  No new complaint.  Objective:  Vital signs in last 24 hours:  Blood pressure 130/78, pulse 62, temperature 98.1 F (36.7 C), temperature source Temporal, resp. rate 18, height 6' 1 (1.854 m), weight 195 lb 14.4 oz (88.9 kg), SpO2 100%.   Lymphatics: No cervical, supraclavicular, axillary, or inguinal nodes Resp: Lungs clear bilaterally Cardio: Regular rate and rhythm GI: No hepatosplenomegaly Vascular: No leg edema  Lab Results:  Lab Results  Component Value Date   WBC 6.2 03/01/2024   HGB 16.4 03/01/2024   HCT 47.2 03/01/2024   MCV 92.7 03/01/2024   PLT 191 03/01/2024   NEUTROABS 4.2 03/01/2024    CMP  Lab Results  Component Value Date   NA 140 03/01/2024   K 4.5 03/01/2024   CL 103 03/01/2024   CO2 27 03/01/2024   GLUCOSE 88 03/01/2024   BUN 14 03/01/2024   CREATININE 1.09 03/01/2024   CALCIUM 10.1 03/01/2024   PROT 7.3 03/01/2024   ALBUMIN 4.8 03/01/2024   AST 37 03/01/2024   ALT 40 03/01/2024   ALKPHOS 90 03/01/2024   BILITOT 0.3 03/01/2024   GFRNONAA >60 03/01/2024   GFRAA >60 11/16/2019     Medications: I have reviewed the patient's current medications.   Assessment/Plan: Thrombocytopenia 02/04/2018 platelet count 19,000 Solu-Medrol  80 mg IV 02/04/2018 Prednisone  60 mg daily beginning 02/05/2018 02/08/2018 platelet count 45,000 Prednisone  40 mg daily beginning 02/11/2018 02/15/2018 platelet count 92,000 02/22/2018 platelet count 200,000 Prednisone  20 mg daily beginning 02/23/2018 Prednisone  15 mg daily beginning 03/09/2018 Prednisone   10 mg daily beginning 03/22/2018 Prednisone  5 mg daily beginning 05/03/2018 Prednisone  2.5 mg daily beginning 05/24/2018 Prednisone  2.5 mg every other day, taper to off  07/27/2018 History of moderate to heavy alcohol use Malaise 02/04/2018 mono screen negative; EBV IgG positive, EBV IgM negative       Disposition: Mr. Hailes appears stable.  The platelet count remains normal.  No clinical evidence for development of a lymphoproliferative disorder.  He will call for spontaneous bleeding.  He would like to continue every 104-month follow-up in the hematology clinic.  He requested we check his thyroid  function today.  He will return for an office and lab visit in 6 months.  I recommended he remain up-to-date on influenza and pneumonia vaccines.  Arley Hof, MD  03/01/2024  3:52 PM

## 2024-03-02 ENCOUNTER — Ambulatory Visit: Payer: Self-pay | Admitting: Oncology

## 2024-03-02 LAB — T4: T4, Total: 6.6 ug/dL (ref 4.5–12.0)

## 2024-03-02 NOTE — Telephone Encounter (Signed)
 Contacted patient, returned phone call, given lab results, voiced verbal understanding, no further questions at this time.

## 2024-03-02 NOTE — Telephone Encounter (Signed)
-----   Message from Arley Hof sent at 03/02/2024  3:10 PM EDT ----- Please call patient, the thyroid  test returned normal, follow-up as scheduled  ----- Message ----- From: Rebecka, Lab [facility] [place] Sent: 03/01/2024   5:10 PM EDT To: Arley KATHEE Hof, MD

## 2024-03-07 DIAGNOSIS — R972 Elevated prostate specific antigen [PSA]: Secondary | ICD-10-CM | POA: Diagnosis not present

## 2024-03-30 DIAGNOSIS — R972 Elevated prostate specific antigen [PSA]: Secondary | ICD-10-CM | POA: Diagnosis not present

## 2024-03-30 DIAGNOSIS — N400 Enlarged prostate without lower urinary tract symptoms: Secondary | ICD-10-CM | POA: Diagnosis not present

## 2024-03-30 DIAGNOSIS — N433 Hydrocele, unspecified: Secondary | ICD-10-CM | POA: Diagnosis not present

## 2024-04-25 DIAGNOSIS — E7800 Pure hypercholesterolemia, unspecified: Secondary | ICD-10-CM | POA: Diagnosis not present

## 2024-05-04 DIAGNOSIS — F418 Other specified anxiety disorders: Secondary | ICD-10-CM | POA: Diagnosis not present

## 2024-05-04 DIAGNOSIS — Z0000 Encounter for general adult medical examination without abnormal findings: Secondary | ICD-10-CM | POA: Diagnosis not present

## 2024-05-04 DIAGNOSIS — R7301 Impaired fasting glucose: Secondary | ICD-10-CM | POA: Diagnosis not present

## 2024-05-04 DIAGNOSIS — E559 Vitamin D deficiency, unspecified: Secondary | ICD-10-CM | POA: Diagnosis not present

## 2024-05-04 DIAGNOSIS — E7800 Pure hypercholesterolemia, unspecified: Secondary | ICD-10-CM | POA: Diagnosis not present

## 2024-05-05 ENCOUNTER — Other Ambulatory Visit (HOSPITAL_BASED_OUTPATIENT_CLINIC_OR_DEPARTMENT_OTHER): Payer: Self-pay | Admitting: Family Medicine

## 2024-05-05 DIAGNOSIS — E7800 Pure hypercholesterolemia, unspecified: Secondary | ICD-10-CM

## 2024-06-07 DIAGNOSIS — H6121 Impacted cerumen, right ear: Secondary | ICD-10-CM | POA: Diagnosis not present

## 2024-08-25 ENCOUNTER — Encounter: Payer: Self-pay | Admitting: Oncology

## 2024-08-26 ENCOUNTER — Telehealth: Payer: Self-pay | Admitting: Oncology

## 2024-08-26 NOTE — Telephone Encounter (Signed)
 Called PT to let him know of updated appt date and time; left detailed voicemail on PT phone.

## 2024-08-26 NOTE — Telephone Encounter (Signed)
 PT returned call, the month of February is too busy for him and would like to come back in March. PT agreed on rescheduled time and date.

## 2024-09-01 ENCOUNTER — Other Ambulatory Visit

## 2024-09-01 ENCOUNTER — Ambulatory Visit: Admitting: Oncology

## 2024-09-07 ENCOUNTER — Inpatient Hospital Stay

## 2024-09-07 ENCOUNTER — Inpatient Hospital Stay: Admitting: Oncology

## 2024-10-18 ENCOUNTER — Inpatient Hospital Stay

## 2024-10-18 ENCOUNTER — Inpatient Hospital Stay: Admitting: Oncology

## 2024-10-24 ENCOUNTER — Inpatient Hospital Stay: Admitting: Oncology

## 2024-10-24 ENCOUNTER — Inpatient Hospital Stay
